# Patient Record
Sex: Female | Born: 1955
Health system: Southern US, Community
[De-identification: ages and names within clinical notes are randomized; demographics above are authoritative.]

## PROBLEM LIST (undated history)

## (undated) DIAGNOSIS — F32A Depression, unspecified: Secondary | ICD-10-CM

## (undated) DIAGNOSIS — F329 Major depressive disorder, single episode, unspecified: Secondary | ICD-10-CM

## (undated) DIAGNOSIS — M5430 Sciatica, unspecified side: Secondary | ICD-10-CM

## (undated) DIAGNOSIS — M1990 Unspecified osteoarthritis, unspecified site: Secondary | ICD-10-CM

## (undated) DIAGNOSIS — E876 Hypokalemia: Secondary | ICD-10-CM

## (undated) DIAGNOSIS — I639 Cerebral infarction, unspecified: Secondary | ICD-10-CM

## (undated) DIAGNOSIS — F419 Anxiety disorder, unspecified: Secondary | ICD-10-CM

## (undated) DIAGNOSIS — R51 Headache: Secondary | ICD-10-CM

## (undated) DIAGNOSIS — D649 Anemia, unspecified: Secondary | ICD-10-CM

## (undated) DIAGNOSIS — I10 Essential (primary) hypertension: Secondary | ICD-10-CM

## (undated) HISTORY — PX: ENDOMETRIAL ABLATION: SHX621

## (undated) HISTORY — PX: BLADDER SURGERY: SHX569

## (undated) HISTORY — PX: TUBAL LIGATION: SHX77

## (undated) HISTORY — DX: Sciatica, unspecified side: M54.30

## (undated) HISTORY — DX: Anxiety disorder, unspecified: F41.9

## (undated) HISTORY — DX: Hypokalemia: E87.6

## (undated) HISTORY — DX: Anemia, unspecified: D64.9

## (undated) HISTORY — PX: BLADDER SUSPENSION: SHX72

## (undated) HISTORY — DX: Depression, unspecified: F32.A

## (undated) HISTORY — DX: Cerebral infarction, unspecified: I63.9

## (undated) HISTORY — DX: Essential (primary) hypertension: I10

## (undated) HISTORY — DX: Headache: R51

## (undated) HISTORY — DX: Unspecified osteoarthritis, unspecified site: M19.90

## (undated) HISTORY — PX: APPENDECTOMY: SHX54

## (undated) HISTORY — DX: Major depressive disorder, single episode, unspecified: F32.9

---

## 2005-09-13 ENCOUNTER — Other Ambulatory Visit: Admission: RE | Admit: 2005-09-13 | Discharge: 2005-09-13 | Payer: Self-pay | Admitting: Gynecology

## 2005-09-20 ENCOUNTER — Encounter: Admission: RE | Admit: 2005-09-20 | Discharge: 2005-09-20 | Payer: Self-pay | Admitting: Gynecology

## 2005-09-28 ENCOUNTER — Ambulatory Visit: Payer: Self-pay | Admitting: Internal Medicine

## 2005-10-05 ENCOUNTER — Encounter: Admission: RE | Admit: 2005-10-05 | Discharge: 2005-10-05 | Payer: Self-pay | Admitting: Gynecology

## 2005-10-12 ENCOUNTER — Ambulatory Visit: Payer: Self-pay | Admitting: Internal Medicine

## 2006-10-10 ENCOUNTER — Other Ambulatory Visit: Admission: RE | Admit: 2006-10-10 | Discharge: 2006-10-10 | Payer: Self-pay | Admitting: Gynecology

## 2006-10-13 ENCOUNTER — Ambulatory Visit (HOSPITAL_COMMUNITY): Admission: RE | Admit: 2006-10-13 | Discharge: 2006-10-13 | Payer: Self-pay | Admitting: Gynecology

## 2006-11-07 ENCOUNTER — Encounter: Admission: RE | Admit: 2006-11-07 | Discharge: 2006-11-07 | Payer: Self-pay | Admitting: Gynecology

## 2006-11-15 ENCOUNTER — Encounter: Payer: Self-pay | Admitting: Internal Medicine

## 2006-12-04 ENCOUNTER — Encounter (INDEPENDENT_AMBULATORY_CARE_PROVIDER_SITE_OTHER): Payer: Self-pay | Admitting: *Deleted

## 2006-12-14 ENCOUNTER — Ambulatory Visit: Payer: Self-pay | Admitting: Internal Medicine

## 2006-12-14 DIAGNOSIS — D649 Anemia, unspecified: Secondary | ICD-10-CM

## 2006-12-14 DIAGNOSIS — R51 Headache: Secondary | ICD-10-CM

## 2006-12-14 DIAGNOSIS — R519 Headache, unspecified: Secondary | ICD-10-CM | POA: Insufficient documentation

## 2006-12-14 DIAGNOSIS — I10 Essential (primary) hypertension: Secondary | ICD-10-CM

## 2007-03-12 ENCOUNTER — Telehealth (INDEPENDENT_AMBULATORY_CARE_PROVIDER_SITE_OTHER): Payer: Self-pay | Admitting: *Deleted

## 2007-03-23 ENCOUNTER — Encounter (INDEPENDENT_AMBULATORY_CARE_PROVIDER_SITE_OTHER): Payer: Self-pay | Admitting: *Deleted

## 2007-04-27 ENCOUNTER — Ambulatory Visit: Payer: Self-pay | Admitting: Internal Medicine

## 2007-07-23 ENCOUNTER — Encounter (INDEPENDENT_AMBULATORY_CARE_PROVIDER_SITE_OTHER): Payer: Self-pay | Admitting: *Deleted

## 2007-08-15 ENCOUNTER — Encounter (INDEPENDENT_AMBULATORY_CARE_PROVIDER_SITE_OTHER): Payer: Self-pay | Admitting: *Deleted

## 2007-08-31 ENCOUNTER — Ambulatory Visit: Payer: Self-pay | Admitting: Internal Medicine

## 2007-08-31 DIAGNOSIS — F3289 Other specified depressive episodes: Secondary | ICD-10-CM | POA: Insufficient documentation

## 2007-08-31 DIAGNOSIS — F419 Anxiety disorder, unspecified: Secondary | ICD-10-CM

## 2007-08-31 DIAGNOSIS — F329 Major depressive disorder, single episode, unspecified: Secondary | ICD-10-CM | POA: Insufficient documentation

## 2007-09-03 LAB — CONVERTED CEMR LAB
BUN: 13 mg/dL (ref 6–23)
Basophils Absolute: 0 10*3/uL (ref 0.0–0.1)
Basophils Relative: 0.2 % (ref 0.0–3.0)
Calcium: 9.2 mg/dL (ref 8.4–10.5)
Chloride: 104 meq/L (ref 96–112)
Creatinine, Ser: 0.8 mg/dL (ref 0.4–1.2)
Eosinophils Relative: 3 % (ref 0.0–5.0)
Glucose, Bld: 83 mg/dL (ref 70–99)
Lymphocytes Relative: 23.2 % (ref 12.0–46.0)
MCHC: 33.2 g/dL (ref 30.0–36.0)
Monocytes Relative: 8 % (ref 3.0–12.0)
Neutro Abs: 6.6 10*3/uL (ref 1.4–7.7)
RDW: 12.7 % (ref 11.5–14.6)
Saturation Ratios: 14.5 % — ABNORMAL LOW (ref 20.0–50.0)
Sodium: 139 meq/L (ref 135–145)
TSH: 2.14 microintl units/mL (ref 0.35–5.50)
Transferrin: 236.2 mg/dL (ref >=212.0)

## 2007-09-21 ENCOUNTER — Encounter (INDEPENDENT_AMBULATORY_CARE_PROVIDER_SITE_OTHER): Payer: Self-pay | Admitting: *Deleted

## 2007-09-28 ENCOUNTER — Ambulatory Visit: Payer: Self-pay | Admitting: Internal Medicine

## 2007-10-12 ENCOUNTER — Telehealth (INDEPENDENT_AMBULATORY_CARE_PROVIDER_SITE_OTHER): Payer: Self-pay | Admitting: *Deleted

## 2007-10-12 ENCOUNTER — Ambulatory Visit: Payer: Self-pay | Admitting: Internal Medicine

## 2007-10-12 LAB — CONVERTED CEMR LAB
BUN: 16 mg/dL (ref 6–23)
CO2: 28 meq/L (ref 19–32)
Chloride: 103 meq/L (ref 96–112)
Glucose, Bld: 98 mg/dL (ref 70–99)
Sodium: 139 meq/L (ref 135–145)

## 2007-10-18 ENCOUNTER — Encounter (INDEPENDENT_AMBULATORY_CARE_PROVIDER_SITE_OTHER): Payer: Self-pay | Admitting: *Deleted

## 2007-12-24 ENCOUNTER — Ambulatory Visit: Payer: Self-pay | Admitting: Internal Medicine

## 2007-12-31 ENCOUNTER — Telehealth (INDEPENDENT_AMBULATORY_CARE_PROVIDER_SITE_OTHER): Payer: Self-pay | Admitting: *Deleted

## 2007-12-31 LAB — CONVERTED CEMR LAB
BUN: 14 mg/dL (ref 6–23)
GFR calc Af Amer: 114 mL/min

## 2008-01-14 ENCOUNTER — Encounter: Payer: Self-pay | Admitting: Internal Medicine

## 2008-01-24 ENCOUNTER — Encounter (INDEPENDENT_AMBULATORY_CARE_PROVIDER_SITE_OTHER): Payer: Self-pay | Admitting: *Deleted

## 2008-03-24 ENCOUNTER — Encounter (INDEPENDENT_AMBULATORY_CARE_PROVIDER_SITE_OTHER): Payer: Self-pay | Admitting: *Deleted

## 2009-02-03 ENCOUNTER — Ambulatory Visit: Payer: Self-pay | Admitting: Gynecology

## 2009-02-13 ENCOUNTER — Ambulatory Visit: Payer: Self-pay | Admitting: Internal Medicine

## 2009-03-18 ENCOUNTER — Telehealth (INDEPENDENT_AMBULATORY_CARE_PROVIDER_SITE_OTHER): Payer: Self-pay | Admitting: *Deleted

## 2009-03-26 ENCOUNTER — Ambulatory Visit: Payer: Self-pay | Admitting: Internal Medicine

## 2009-03-27 ENCOUNTER — Encounter (INDEPENDENT_AMBULATORY_CARE_PROVIDER_SITE_OTHER): Payer: Self-pay | Admitting: *Deleted

## 2009-03-30 LAB — CONVERTED CEMR LAB
Basophils Absolute: 0.1 10*3/uL (ref 0.0–0.1)
Basophils Relative: 0.7 % (ref 0.0–3.0)
Eosinophils Absolute: 0.2 10*3/uL (ref 0.0–0.7)
Eosinophils Relative: 2.1 % (ref 0.0–5.0)
HDL: 41.4 mg/dL (ref >39.00)
Hemoglobin: 12.7 g/dL (ref 12.0–15.0)
Lymphocytes Relative: 24.7 % (ref 12.0–46.0)
Lymphs Abs: 2.2 10*3/uL (ref 0.7–4.0)
MCHC: 33.5 g/dL (ref 30.0–36.0)
Monocytes Absolute: 0.7 10*3/uL (ref 0.1–1.0)
Monocytes Relative: 7.6 % (ref 3.0–12.0)
Neutro Abs: 5.6 10*3/uL (ref 1.4–7.7)
Neutrophils Relative %: 64.9 % (ref 43.0–77.0)
Platelets: 239 10*3/uL (ref 150.0–400.0)
Potassium: 3 meq/L — ABNORMAL LOW (ref 3.5–5.1)
RDW: 12.6 % (ref 11.5–14.6)
Sodium: 142 meq/L (ref 135–145)
WBC: 8.8 10*3/uL (ref 4.5–10.5)

## 2009-04-20 ENCOUNTER — Ambulatory Visit: Payer: Self-pay | Admitting: Internal Medicine

## 2009-04-20 ENCOUNTER — Telehealth: Payer: Self-pay | Admitting: Internal Medicine

## 2009-08-04 ENCOUNTER — Ambulatory Visit: Payer: Self-pay | Admitting: Internal Medicine

## 2009-10-13 ENCOUNTER — Telehealth (INDEPENDENT_AMBULATORY_CARE_PROVIDER_SITE_OTHER): Payer: Self-pay | Admitting: *Deleted

## 2009-12-23 ENCOUNTER — Encounter (INDEPENDENT_AMBULATORY_CARE_PROVIDER_SITE_OTHER): Payer: Self-pay | Admitting: Internal Medicine

## 2009-12-23 ENCOUNTER — Observation Stay (HOSPITAL_COMMUNITY)
Admission: EM | Admit: 2009-12-23 | Discharge: 2009-12-24 | Payer: Self-pay | Source: Home / Self Care | Attending: Internal Medicine | Admitting: Internal Medicine

## 2010-01-19 ENCOUNTER — Other Ambulatory Visit: Payer: Self-pay | Admitting: Internal Medicine

## 2010-01-19 ENCOUNTER — Ambulatory Visit
Admission: RE | Admit: 2010-01-19 | Discharge: 2010-01-19 | Payer: Self-pay | Source: Home / Self Care | Attending: Internal Medicine | Admitting: Internal Medicine

## 2010-01-19 DIAGNOSIS — R0989 Other specified symptoms and signs involving the circulatory and respiratory systems: Secondary | ICD-10-CM

## 2010-01-19 DIAGNOSIS — R0609 Other forms of dyspnea: Secondary | ICD-10-CM | POA: Insufficient documentation

## 2010-01-19 DIAGNOSIS — E876 Hypokalemia: Secondary | ICD-10-CM | POA: Insufficient documentation

## 2010-01-20 LAB — BASIC METABOLIC PANEL
BUN: 19 mg/dL (ref 6–23)
CO2: 29 mEq/L (ref 19–32)
Calcium: 9.2 mg/dL (ref 8.4–10.5)
Chloride: 102 mEq/L (ref 96–112)
Creatinine, Ser: 0.6 mg/dL (ref 0.4–1.2)
GFR: 111.34 mL/min (ref >60.00)
Glucose, Bld: 77 mg/dL (ref 70–99)
Potassium: 3 mEq/L — ABNORMAL LOW (ref 3.5–5.1)
Sodium: 139 mEq/L (ref 135–145)

## 2010-01-23 ENCOUNTER — Encounter: Payer: Self-pay | Admitting: Gynecology

## 2010-01-24 ENCOUNTER — Encounter: Payer: Self-pay | Admitting: Gynecology

## 2010-01-27 ENCOUNTER — Encounter (INDEPENDENT_AMBULATORY_CARE_PROVIDER_SITE_OTHER): Payer: Self-pay | Admitting: *Deleted

## 2010-02-01 ENCOUNTER — Ambulatory Visit
Admission: RE | Admit: 2010-02-01 | Discharge: 2010-02-01 | Payer: Self-pay | Source: Home / Self Care | Attending: Internal Medicine | Admitting: Internal Medicine

## 2010-02-02 NOTE — Progress Notes (Addendum)
Summary: ifob  call from lab - pt never returned ifob kit [facility]  April 20, 2009 2:58 PM send letter encouraging her to return the [employer] E. Adiya Selmer MD  April 20, 2009 5:04 PM   Ms. Noblet, Please return the stool kit that was provided to you at the last office visit.  You can return it in the envelope that was in the kit.  Please call me if you have any questions.  [phone] Shary Decamp  April 21, 2009 4:57 PM

## 2010-02-02 NOTE — Progress Notes (Addendum)
Summary: METOPROLOL REFILL  Phone Note Refill Request Call back at [phone] Message from:  Patient on October 13, 2009 11:43 AM  Refills Requested: Medication #1:  METOPROLOL TARTRATE 50 MG TABS TAKE 1 by mouth BID [facility]---[place], [place]---[address]  Initial call taken by: Jerolyn Shin,  October 13, 2009 11:44 AM    Prescriptions: METOPROLOL TARTRATE 50 MG TABS (METOPROLOL TARTRATE) TAKE 1 by mouth BID  #60 x 6   Entered by:   Army Fossa CMA   Authorized by:   Nolon Rod. Paz MD   Signed by:   Army Fossa CMA on 10/13/2009   Method used:   Electronically to        [facility] # [id number]* (retail)       [address] [place], [place]  [address]       Ph: [phone]       Fax: [phone]   RxID:   [id number]

## 2010-02-02 NOTE — Assessment & Plan Note (Addendum)
Summary: cpx/ns/fastong/kdc   Vital Signs:  Patient profile:   55 year old female Height:      64 inches Weight:      155.2 pounds BMI:     26.74 Pulse rate:   54 / minute BP sitting:   132 / 80  Vitals Entered By: Shary Decamp (March 26, 2009 8:44 AM) CC: cpx --   History of Present Illness: CPX recovering from the flu: F-cough-N-lack of appetite    Preventive Screening-Counseling & Management  Caffeine-Diet-Exercise     Does Patient Exercise: yes     Times/week: 2      Drug Use:  no.    Current Medications (verified): 1)  Metoprolol Tartrate 50 Mg Tabs (Metoprolol Tartrate) .... Take 1 By Mouth Bid  Allergies (verified): 1)  ! Lisinopril (Lisinopril)  Past History:  Past Medical History: Reviewed history from 08/31/2007 and no changes required. Anemia-NOS, h/o Hypertension Headache Anxiety Depression  Past Surgical History: Reviewed history from 12/14/2006 and no changes required. 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 Resp:  Denies cough and shortness of breath. GI:  Denies bloody stools. GU:  Denies dysuria and hematuria. Psych:  history of anxiety and depression, off meds  "don't like to take" symptoms ok, does have some family problems, on off mood is depressive.  Physical Exam  General:  alert, well-developed, and well-nourished.   Neck:  no masses and no thyromegaly.   Lungs:  normal respiratory effort, no intercostal retractions, no accessory muscle use, and  normal breath sounds.   Heart:  normal rate, regular rhythm, no murmur, and no gallop.   Abdomen:  soft, non-tender, no distention, no masses, no guarding, and no rigidity.   Extremities:  no edema Psych:  Cognition and judgment appear intact. Alert and cooperative with normal attention span and concentration. not anxious appearing and not depressed appearing.     Impression & Recommendations:  Problem # 1:  ROUTINE GENERAL MEDICAL EXAM@HEALTH  CARE FACL (ICD-V70.0) she does have a healthy diet, encouraged to do more exercises Td > 10 years ---got one today never had a cscope , no family history Colonoscopy Vs.iFOB cards reviewed w/ pt. Provided  iFOB but he will  call if he decides to have a  colonoscopy  female care per gyn due for MMG , got a letter,encouraged to go ahead and have a MMG never had a bone density test, she is not on her menopause yet  Orders: Venipuncture (28315) TLB-ALT (SGPT) (84460-ALT) TLB-AST (SGOT) (84450-SGOT) TLB-BMP (Basic Metabolic Panel-BMET) (80048-METABOL) TLB-CBC Platelet - w/Differential (85025-CBCD) TLB-TSH (Thyroid Stimulating Hormone) (84443-TSH) TLB-Lipid Panel (80061-LIPID) EKG w/ Interpretation (93000)  Problem # 2:  HYPERTENSION (ICD-401.9) well  controlled  on metoprolol, pulse is slightly low but patient asymptomatic, no change advised to call us if she feels weak, dizzy when she stands up  EKG, sinus bradycardia Her updated medication list for this problem includes:    Metoprolol Tartrate 50 Mg Tabs (Metoprolol tartrate) .Marland Kitchen... Take 1 by mouth bid  BP today: 132/80 Prior BP: 150/100 (  02/13/2009)  Labs Reviewed: K+: 3.2 (12/24/2007) Creat: : 0.7 (12/24/2007)     Complete Medication List: 1)  Metoprolol Tartrate 50 Mg Tabs (Metoprolol tartrate) .... Take 1 by mouth bid  Other Orders: Tdap => 80yrs IM (84696) Admin 1st Vaccine (29528) Admin 1st Vaccine [facility]) [phone])  Patient Instructions: 1)  Please schedule a follow-up  appointment in 4 months .    Risk Factors:  Tobacco use:  never Drug use:  no Alcohol use:  no Exercise:  yes    Times per week:  2   Tetanus/Td Vaccine    Vaccine Type: Tdap    Site: left deltoid    Mfr: GlaxoSmithKline    Dose: 0.5 ml    Route: IM    Given by: Shary Decamp    Exp. Date: 03/28/2011    Lot #: [id number]

## 2010-02-02 NOTE — Letter (Addendum)
Summary: [facility]: Immunoassay Fecal Occult Blood (iFOB) Order Form  [facility] [place]  [address] [place], [place] [address]   Phone: [phone]  Fax: [phone]      [facility]: Immunoassay Fecal Occult Blood (iFOB) Order Form   March 27, 2009 MRN: [id number]   [name] 10-10-1955   Physicican Name:______paz___________________  Diagnosis Code:________v76.51__________________      [name]

## 2010-02-02 NOTE — Progress Notes (Addendum)
Summary: [facility] 3/18, 3/23  Phone Note Call from Patient Call back at Home Phone [phone]   Caller: Spouse Summary of Call: chest pain (center), HA x yesterday.   Pt with hx of HTN....  Advised husband to take pt to ED for evaluation Shary Decamp  March 18, 2009 8:30 AM   Follow-up for Phone Call        check on her in a few days Follow-up by: [facility] E. Paz MD,  March 18, 2009 4:52 PM  Additional Follow-up for Phone Call Additional follow up Details #1::        left message on machine for pt to return call Shary Decamp  March 20, 2009 9:34 AM  left message on machine for pt to return call Shary Decamp  March 25, 2009 2:33 PM  [pt here for ov today Shary Decamp  March 26, 2009 8:36 AM

## 2010-02-02 NOTE — Assessment & Plan Note (Addendum)
Summary: follow up on bp meds//lh   Vital Signs:  Patient profile:   55 year old female Height:      64 inches Weight:      157.8 pounds BMI:     27.18 Pulse rate:   80 / minute BP sitting:   150 / 100  Vitals Entered By: Shary Decamp (February 13, 2009 8:21 AM) CC: rov   History of Present Illness: last OV 12-09 walk in BP was elevated @ her gynecologist office , she was off meds x 3 days  she used to be on : Metoprolol Tartrate 50 Mg Tabs (Metoprolol tartrate) .... Take 1 by mouth bid Klor-con M10 10 Meq   her  gyn  Rx  metoprolol HCT 50/25 feels well  BP today elevated   Current Medications (verified): 1)  Metoprolol Tartrate 50 Mg Tabs (Metoprolol Tartrate) .... Take 1 By Mouth Bid 2)  Klor-Con M10 10 Meq Cr-Tabs (Potassium Chloride Crys Cr) .Marland Kitchen.. 1 By Mouth Once Daily 3)  Fluoxetine Hcl 20 Mg Caps (Fluoxetine Hcl) .Marland Kitchen.. 1 A Day (Patient Takes It As Needed) 4)  Flexeril 10 Mg  Tabs (Cyclobenzaprine Hcl) .Marland Kitchen.. 1 By Mouth At Bedtime As Needed Headache  Allergies (verified): 1)  ! Lisinopril (Lisinopril)  Past History:  Past Medical History: Reviewed history from 08/31/2007 and no changes required. Anemia-NOS, h/o Hypertension Headache Anxiety Depression  Past Surgical History: Reviewed history from 12/14/2006 and no changes required. Appendectomy Caesarean section Tubal ligation bladder surgery  Social History: Reviewed history from 08/31/2007 and no changes required. Married 3 kids  Review of Systems       h/o migraines, still has HA on-off , usually related to her periods some LE edema  CV:  Denies chest pain or discomfort and shortness of breath with exertion. GI:  Denies nausea and vomiting.  Physical Exam  General:  alert, well-developed, and well-nourished.   Lungs:  normal respiratory effort, no intercostal retractions, no accessory muscle use, and normal breath sounds.   Heart:  normal rate, regular rhythm, and no murmur.   Extremities:   no edema   Impression & Recommendations:  Problem # 1:  HYPERTENSION (ICD-401.9) in the past, BP was well controlled with metoprolol 50 b.i.d. she had some problems with potassium being low, consequently HCTZ may not be a good choice for her Will discontinue her metoprolol HCT which was prescribed a few days ago and put her back on plain  metoprolol, recheck potassium on return to the office   Her updated medication list for this problem includes:    Metoprolol Tartrate 50 Mg Tabs (Metoprolol tartrate) .Marland Kitchen... Take 1 by mouth bid  Complete Medication List: 1)  Metoprolol Tartrate 50 Mg Tabs (Metoprolol tartrate) .... Take 1 by mouth bid  Patient Instructions: 1)  take metoprolol twice a day 2)  office  visit in one month, fasting, for your yearly checkup Prescriptions: METOPROLOL TARTRATE 50 MG TABS (METOPROLOL TARTRATE) TAKE 1 by mouth BID  #60 x 6   Entered and Authorized by:   Nolon Rod. Geovonni Meyerhoff MD   Signed by:   Nolon Rod. Melis Trochez MD on 02/13/2009   Method used:   Print then Give to Patient   RxID:   [id number]

## 2010-02-02 NOTE — Assessment & Plan Note (Addendum)
Summary: 4 month followup/kn   Vital Signs:  Patient profile:   55 year old female Weight:      154.13 pounds Pulse rate:   65 / minute Pulse rhythm:   regular BP sitting:   140 / 82  (left arm) Cuff size:   large  Vitals Entered By: Army Fossa CMA (August 04, 2009 11:06 AM) CC: [name] here for 4 month f/u. Comments - F/u on BP. - Was seen in Hospital last week for pain in Right leg- states its better now.   History of Present Illness: acute back pain last week Pain was left-sided, rad.  to the left leg up to the heel She went to [facility], x-rays were done, she got 2 shots and prescribed prednisone She also was prescribed Flexeril but she discontinued  due to somnolence  Overall the pain is now decreased, she still has on and off "cramps and pain" in the left leg  ROS Denies fevers No bladder or bowel incontinence  Current Medications (verified): 1)  Metoprolol Tartrate 50 Mg Tabs (Metoprolol Tartrate) .... Take 1 By Mouth Bid 2)  Prednisone 10 Mg Tabs (Prednisone) 3)  Ibuprofen 800 Mg Tabs (Ibuprofen) .Marland Kitchen.. 1 By Mouth Three Times A Day 4)  Flexeril 10 Mg Tabs (Cyclobenzaprine Hcl) .Marland Kitchen.. 1 By Mouth Three Times A Day As Needed  Allergies: 1)  ! Lisinopril (Lisinopril)  Past History:  Past Medical History: Reviewed history from 08/31/2007 and no changes required. Anemia-NOS, h/o Hypertension Headache Anxiety Depression  Past Surgical History: Reviewed history from 12/14/2006 and no changes required. Appendectomy Caesarean section Tubal ligation bladder surgery  Social History: Reviewed history from 03/26/2009 and no changes required. 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   Msk:  not tender to palpation at the lower spine or sacroiliac area Extremities:  no lower extremity  edema Neurologic:  gait normal Posture slightly antalgic Lower extremity motor exam normal Reflexes symmetric except for decrease ankle jerk on the left Straight leg test positive on the left   Impression & Recommendations:  Problem # 1:  BACK PAIN, ACUTE (ICD-724.5)  acute lumbalgia with radiculopathy features No fever, no bladder or bowel incontinence Plan: Finish  prednisone somnolence was likely from Flexeril, she was also prescribed ibuprofen. Will continue with ibuprofen and Hydrocodone as needed , see instructions MRI ortho referral The following medications were removed from the medication list:    Flexeril 10 Mg Tabs (Cyclobenzaprine hcl) .Marland Kitchen... 1 by mouth three times a day as needed Her updated medication list for this problem includes:    Ibuprofen 800 Mg Tabs (Ibuprofen) .Marland Kitchen... 1 by mouth three times a day    Hydrocodone-acetaminophen 5-500 Mg Tabs (Hydrocodone-acetaminophen) ..... One tablet every 4 hours as needed for pain  Orders: Radiology Referral (Radiology) Orthopedic Referral (Ortho)  Problem # 2:  HYPERTENSION (ICD-401.9) well-controlled History of hypokalemia, currently prednisone. Plan to recheck her potassium when she comes back Her updated medication list for this problem includes:    Metoprolol Tartrate 50 Mg Tabs (Metoprolol tartrate) .Marland Kitchen... Take 1 by mouth bid  BP today: 140/82 Prior BP: 132/80 (03/26/2009)  Labs Reviewed: K+: 3.0 (03/26/2009) Creat: : 0.8 (03/26/2009)   Chol: 162 (03/26/2009)   HDL: 41.40 (03/26/2009)   LDL: 92 (03/26/2009)   TG: 144.0 (03/26/2009)  Complete Medication List: 1)  Metoprolol Tartrate 50 Mg Tabs (Metoprolol tartrate) .... Take 1 by mouth bid 2)  Prednisone 10 Mg Tabs (Prednisone) 3)  Ibuprofen 800 Mg Tabs (Ibuprofen) .Marland Kitchen.. 1 by mouth three times a day 4)  Hydrocodone-acetaminophen 5-500 Mg Tabs (Hydrocodone-acetaminophen) .... One tablet every 4 hours as needed for pain  Patient Instructions: 1)  for pain take  ibuprofen 800 mg every 8 hours as needed. Always take it  with food. Watch for stomach irritation  2)  If the pain is severe, instead of ibuprofen  take hydrocodone 3)  Please schedule a follow-up appointment in 2 months.  Prescriptions: HYDROCODONE-ACETAMINOPHEN 5-500 MG TABS (HYDROCODONE-ACETAMINOPHEN) one tablet every 4 hours as needed for pain  #30 x 0   Entered and Authorized by:   Nolon Rod. Leata Dominy MD   Signed by:   Nolon Rod. Jene Oravec MD on 08/04/2009   Method used:   Print then Give to Patient   RxID:   [id number]

## 2010-02-04 NOTE — Assessment & Plan Note (Addendum)
Summary: Hospital followup---...   Vital Signs:  Patient profile:   55 year old female Height:      64 inches Weight:      160.13 pounds BMI:     27.59 Pulse rate:   86 / minute Pulse rhythm:   regular BP sitting:   152 / 98  (left arm) Cuff size:   large  Vitals Entered By: Army Fossa CMA (January 19, 2010 3:13 PM)  History of Present Illness: here for hospital followup, chart is reviewed   DATE OF ADMISSION:  12/23/2009   DATE OF DISCHARGE:  12/24/2009      DISCHARGE DIAGNOSES:   1. Chest pain/back pain.   2. Diastolic heart failure.   3. Hypertension.   4. Hypokalemia.    PROCEDURES PERFORMED:   1. Two-D echo that showed left ventricular function was within normal       with an EF of 60-65%.  She has a grade 2 diastolic heart failure,       left atrium was mildly dilated.   2. CT angio of the chest showed negative CT angio with no evidence of       PE, some cardiomegaly, nodular goiter.    Chest x-ray:  Cardiomegaly   without edema.    labs On admission potassium was 2.9,  Mg was normal, creatinine 0.7. CBC was normal Total cholesterol 158, LDL 87, HDL 44 TSH normal Hemoglobin A1c 5.8 aldosterone  and cortisone levels  within normal. --------------------------------------------------------------------------------------------- the patient was admitted with uncontrolled hypertension, initial BP was 210/110. She also had chest pain and left back pain felt to be musculoskeletal but she was recommended outpatient stress test. she was noted to be severely hypokalemic upon admission, she was only Rx  beta blockers . But she reports that she was actually taking no medicines  She was discharged in the following medications: Hydrochlorothiazide 12.5 mg p.o. daily  ibuprofen 600 mg p.o. q.6-8 h. p.r.n.  lisinopril 10 mg daily-------------------not taking  metoprolol 25 mg b.i.d. omeprazole 40 mg daily  K-Dur 20 mEq daily-------------------------- not taking   calcium 1 tablet daily  Excedrin 1 tablet q.6 h. multivitamin 1  tablet daily.       Current Medications (verified): 1)  Metoprolol Tartrate 25 Mg Tabs (Metoprolol Tartrate) .Marland Kitchen.. 1 By Mouth Bid 2)  Ibuprofen 800 Mg Tabs (Ibuprofen) .Marland Kitchen.. 1 By Mouth Three Times A Day 3)  Hydrochlorothiazide 12.5 Mg Caps (Hydrochlorothiazide) .Marland Kitchen.. 1 By Mouth Once Daily. 4)  Omeprazole 40 Mg Cpdr (Omeprazole) .Marland Kitchen.. 1 By Mouth Once Daily  Allergies (verified): 1)  ! Lisinopril (Lisinopril)  Past History:  Past Medical History: Anemia-NOS, h/o Hypertension Hypokalemia  Headache Anxiety, Depression  Past Surgical History: Reviewed history from 12/14/2006 and no changes required. Appendectomy Caesarean section Tubal ligation bladder surgery  Family History: Reviewed history from 03/26/2009 and no changes required. MI-- father age 73 DM--aunt HTN-- many family memebers  colon ca--no Breast ca--distant relative  Social History: Reviewed history from 03/26/2009 and no changes required. 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 She continued to have dyspnea on exertion no nausea, vomiting, diarrhea No fever or growth Taking Prilosec regularly, no GERD symptoms  Physical Exam  General:  alert, well-developed, and well-nourished.  Lungs:  normal respiratory effort, no intercostal retractions, no accessory muscle use, and normal breath sounds.   Heart:  normal rate, regular rhythm, no murmur, and no gallop.   Abdomen:  soft, non-tender, no distention, no masses, no guarding, and no rigidity.   Extremities:  no pretibial edema bilaterally    Impression & Recommendations:  Problem # 1:  HYPERTENSION (ICD-401.9) recently admitted with severe  hypertension and hypokalemia in addition of her current meds, she was recommended to take lisinopril  and potassium but she's not. her BP is still elevated. due to the severe hypertension and hypokalemia (on no medicines) , I will refer her to nephrology noting that her aldosterone was normal at the hospital today I will check a BMP  Will add losartan d/c HCTZ  Her updated medication list for this problem includes:    Metoprolol Tartrate 25 Mg Tabs (Metoprolol tartrate) .Marland Kitchen... 1 by mouth bid    Losartan Potassium 50 Mg Tabs (Losartan potassium) ..... One by mouth daily  Orders: Venipuncture (95638) TLB-BMP (Basic Metabolic Panel-BMET) (80048-METABOL) Specimen Handling (75643) Nephrology Referral (Nephro)  Problem # 2:  HYPOKALEMIA (ICD-276.8)  see #1  Orders: Nephrology Referral (Nephro)  Problem # 3:  DYSPNEA ON EXERTION (ICD-786.09) recent episode of chest pain which is now resolved Continue with  dyspnea on exertion. recent echocardiogram showed  a grade 2 diastolic heart failure   left atrium was mildly dilated.  plan: stress test once BP better  Her updated medication list for this problem includes:    Metoprolol Tartrate 25 Mg Tabs (Metoprolol tartrate) .Marland Kitchen... 1 by mouth bid  Problem # 4:  ? of GERD (ICD-530.81) onn PPIs, reason ? change omeprazole to as needed  Her updated medication list for this problem includes:    Omeprazole 40 Mg Cpdr (Omeprazole) .Marland Kitchen... 1 by mouth once daily only as needed if indigestion  Problem # 5:  I spent additional time reviewing her new medication list  Complete Medication List: 1)  Metoprolol Tartrate 25 Mg Tabs (Metoprolol tartrate) .Marland Kitchen.. 1 by mouth bid 2)  Losartan Potassium 50 Mg Tabs (Losartan potassium) .... One by mouth daily 3)  Omeprazole 40 Mg Cpdr (Omeprazole) .Marland Kitchen.. 1 by mouth once daily only as needed if indigestion 4)  Ibuprofen 800 Mg Tabs (Ibuprofen) .Marland Kitchen.. 1 by mouth three times a day only as needed  Patient  Instructions: 1)  Please schedule a follow-up appointment in 2 weeks.  Prescriptions: LOSARTAN POTASSIUM 50 MG TABS (LOSARTAN POTASSIUM) one by mouth daily  #30 x 1   Entered and Authorized by:   Elita Quick E. Paz MD   Signed by:   Nolon Rod. Paz MD on 01/19/2010   Method used:   Print then Give to Patient   RxID:   [id number]    Orders Added: 1)  Venipuncture [60109] 2)  TLB-BMP (Basic Metabolic Panel-BMET) [80048-METABOL] 3)  Specimen Handling [99000] 4)  Est. Patient Level IV [32355] 5)  Nephrology Referral [Nephro]

## 2010-02-04 NOTE — Letter (Addendum)
Summary: Unable To Reach-Consult Scheduled  [facility] [place]  [address] [place], [place] [address]   Phone: [phone]  Fax: [phone]    01/27/2010 MRN: [id number]    Dear Ms. Carmin Richmond,   We have been unable to reach you by phone.  Please contact our office with an updated phone number.     If you have any question please call us.     Thank you,  Army Fossa CMA  January 27, 2010 1:37 PM

## 2010-02-10 NOTE — Assessment & Plan Note (Addendum)
Summary: 2 week followup///sph   Vital Signs:  Patient profile:   55 year old female Height:      64 inches (162.56 cm) Weight:      157.38 pounds (71.54 kg) BMI:     27.11 Temp:     98.3 degrees F (36.83 degrees C) oral BP sitting:   160 / 100  (left arm) Cuff size:   regular  Vitals Entered By: Lucious Groves CMA (February 01, 2010 3:20 PM) CC: 2 week follow up./kb Is Patient Diabetic? No Pain Assessment Patient in pain? no      Comments Patient notes that she lost Omeprazole prescription.   History of Present Illness: followup from previous visit Unfortunately, she did not start  losartan, she lost her prescription Feels in general well except for a URI that is already getting better.  Today, she also complained of right knee pain for at least 1  week, pain is moderate to intense sometimes. she  wonders if the pain is related to kneeling a lot at work. No redness , mild swelling  ROS Continued with fatigue No chest or shortness of breath + mild headache , takes ibuprofen for it. She did discontinue hydrochlorothiazide as directed  Current Medications (verified): 1)  Metoprolol Tartrate 25 Mg Tabs (Metoprolol Tartrate) .Marland Kitchen.. 1 By Mouth Bid 2)  Losartan Potassium 50 Mg Tabs (Losartan Potassium) .... One By Mouth Daily 3)  Omeprazole 40 Mg Cpdr (Omeprazole) .Marland Kitchen.. 1 By Mouth Once Daily Only As Needed If Indigestion 4)  Ibuprofen 800 Mg Tabs (Ibuprofen) .Marland Kitchen.. 1 By Mouth Three Times A Day Only As Needed  Allergies (verified): 1)  ! Lisinopril (Lisinopril)  Past History:  Past Medical History: Reviewed history from 01/19/2010 and no changes required. Anemia-NOS, h/o Hypertension Hypokalemia  Headache Anxiety, Depression  Past Surgical History: Reviewed history from 12/14/2006 and no changes required. Appendectomy Caesarean section Tubal ligation bladder surgery  Social History: Reviewed history from 03/26/2009 and no changes required. 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   Extremities:  number lower extremity edema Left knee normal Right knee without redness or actual effusion on physical exam ( slightly puffy?). Range of motion is normal, knee  is stable   Impression & Recommendations:  Problem # 1:  HYPERTENSION (ICD-401.9) unfortunately, she did not start losartan as indicated. new prescription provided see instructions  nephrology consultation pending, they will contact the patient directly  Her updated medication list for this problem includes:    Metoprolol Tartrate 25 Mg Tabs (Metoprolol tartrate) .Marland Kitchen... 1 by mouth two times a day    Losartan Potassium 50 Mg Tabs (Losartan potassium) ..... One by mouth daily  BP today: 160/100 Prior BP: 152/98 (01/19/2010)  Labs Reviewed: K+: 3.0 (01/19/2010) Creat: : 0.6 (01/19/2010)   Chol: 162 (03/26/2009)   HDL: 41.40 (03/26/2009)   LDL: 92 (03/26/2009)   TG: 144.0 (03/26/2009)  Problem # 2:  KNEE PAIN (XBJ-478.29) Assessment: New she does a lot of kneeling at work, strongly recommend to use knee  pads. ok  to take ibuprofen or Tylenol as needed Her updated medication list for this problem includes:    Ibuprofen 800 Mg Tabs (Ibuprofen) .Marland Kitchen... 1 by mouth three times a day only as needed  Complete Medication List: 1)  Metoprolol Tartrate 25 Mg Tabs (Metoprolol tartrate) .Marland Kitchen.. 1 by mouth two times a day  2)  Losartan Potassium 50 Mg Tabs (Losartan potassium) .... One by mouth daily 3)  Omeprazole 40 Mg Cpdr (Omeprazole) .Marland Kitchen.. 1 by mouth once daily only as needed if indigestion 4)  Ibuprofen 800 Mg Tabs (Ibuprofen) .Marland Kitchen.. 1 by mouth three times a day only as needed  Patient Instructions: 1)  start losartan today 2)  Come back in 3 weeks for a checkup Prescriptions: METOPROLOL TARTRATE 25 MG TABS (METOPROLOL TARTRATE) 1 by mouth  two times a day  #60 x 1   Entered and Authorized by:   Elita Quick E. Timmi Devora MD   Signed by:   Nolon Rod. Javier Mamone MD on 02/01/2010   Method used:   Print then Give to Patient   RxID:   [id number] LOSARTAN POTASSIUM 50 MG TABS (LOSARTAN POTASSIUM) one by mouth daily  #30 x 1   Entered and Authorized by:   Elita Quick E. Nicholas Trompeter MD   Signed by:   Nolon Rod. Braylie Badami MD on 02/01/2010   Method used:   Print then Give to Patient   RxID:   [id number]    Orders Added: 1)  Est. Patient Level III [10272]

## 2010-02-22 ENCOUNTER — Ambulatory Visit: Payer: Self-pay | Admitting: Internal Medicine

## 2010-03-01 ENCOUNTER — Ambulatory Visit (INDEPENDENT_AMBULATORY_CARE_PROVIDER_SITE_OTHER): Payer: Managed Care, Other (non HMO) | Admitting: Internal Medicine

## 2010-03-01 ENCOUNTER — Other Ambulatory Visit: Payer: Self-pay | Admitting: Internal Medicine

## 2010-03-01 ENCOUNTER — Encounter: Payer: Self-pay | Admitting: Internal Medicine

## 2010-03-01 DIAGNOSIS — I10 Essential (primary) hypertension: Secondary | ICD-10-CM

## 2010-03-02 LAB — BASIC METABOLIC PANEL
BUN: 18 mg/dL (ref 6–23)
CO2: 28 mEq/L (ref 19–32)
Chloride: 99 mEq/L (ref 96–112)
Creatinine, Ser: 0.7 mg/dL (ref 0.4–1.2)

## 2010-03-11 NOTE — Assessment & Plan Note (Addendum)
Summary: rto 3 months/cbs   Vital Signs:  Patient profile:   55 year old female Weight:      158 pounds Pulse rate:   68 / minute Pulse rhythm:   regular BP sitting:   122 / 76  (left arm) Cuff size:   regular  Vitals Entered By: Army Fossa CMA (March 01, 2010 2:53 PM) CC: follow up visit- no complaints Comments [facility] [address]    History of Present Illness:  blood pressure followup Good medication compliance  Feeling really good    Review of systems   no nausea, vomiting, diarrhea  headaches have decreased  had one episode of transient dizziness and weakness, lasted a few hours, no associated slurred speech or focal deficits. Did not check her blood pressure at the time of the symptoms  Current Medications (verified): 1)  Metoprolol Tartrate 25 Mg Tabs (Metoprolol Tartrate) .Marland Kitchen.. 1 By Mouth Two Times A Day 2)  Losartan Potassium 50 Mg Tabs (Losartan Potassium) .... One By Mouth Daily 3)  Omeprazole 40 Mg Cpdr (Omeprazole) .Marland Kitchen.. 1 By Mouth Once Daily Only As Needed If Indigestion 4)  Ibuprofen 800 Mg Tabs (Ibuprofen) .Marland Kitchen.. 1 By Mouth Three Times A Day Only As Needed  Allergies (verified): 1)  ! Lisinopril (Lisinopril)  Past History:  Past Medical History: Reviewed history from 01/19/2010 and no changes required. Anemia-NOS, h/o Hypertension Hypokalemia  Headache Anxiety, Depression  Physical Exam  General:  alert and well-developed.   Lungs:  normal respiratory effort, no intercostal retractions, no accessory muscle use, and normal breath sounds.   Heart:  normal rate, regular rhythm, no murmur, and no gallop.   Neurologic:   speech normal Gait normal No obvious motor deficits   Impression & Recommendations:  Problem # 1:  HYPERTENSION (ICD-401.9) improved  Her updated medication list for this problem includes:    Metoprolol Tartrate 25 Mg Tabs (Metoprolol tartrate) .Marland Kitchen... 1 by mouth two times a day    Losartan Potassium 50 Mg Tabs (Losartan  potassium) ..... One by mouth daily  Orders: Venipuncture (10272) TLB-BMP (Basic Metabolic Panel-BMET) (80048-METABOL) Specimen Handling (53664)  BP today: 122/76 Prior BP: 160/100 (02/01/2010)  Labs Reviewed: K+: 3.0 (01/19/2010) Creat: : 0.6 (01/19/2010)   Chol: 162 (03/26/2009)   HDL: 41.40 (03/26/2009)   LDL: 92 (03/26/2009)   TG: 144.0 (03/26/2009)  Complete Medication List: 1)  Metoprolol Tartrate 25 Mg Tabs (Metoprolol tartrate) .Marland Kitchen.. 1 by mouth two times a day 2)  Losartan Potassium 50 Mg Tabs (Losartan potassium) .... One by mouth daily 3)  Omeprazole 40 Mg Cpdr (Omeprazole) .Marland Kitchen.. 1 by mouth once daily only as needed if indigestion 4)  Ibuprofen 800 Mg Tabs (Ibuprofen) .Marland Kitchen.. 1 by mouth three times a day only as needed  Patient Instructions: 1)  Check your blood pressure 2 or 3 times a week. If it is more than 140/85 consistently,please let us know  2)  Please schedule a follow-up appointment in 6 months .  Prescriptions: LOSARTAN POTASSIUM 50 MG TABS (LOSARTAN POTASSIUM) one by mouth daily  #30 x 6   Entered and Authorized by:   Elita Quick E. Paz MD   Signed by:   Nolon Rod. Paz MD on 03/01/2010   Method used:   Print then Give to Patient   RxID:   [id number] METOPROLOL TARTRATE 25 MG TABS (METOPROLOL TARTRATE) 1 by mouth two times a day  #60 x 6   Entered and Authorized by:   Nolon Rod. Paz MD  Signed by:   Nolon Rod Paz MD on 03/01/2010   Method used:   Print then Give to Patient   RxID:   [id number]    Orders Added: 1)  Venipuncture [56213] 2)  TLB-BMP (Basic Metabolic Panel-BMET) [80048-METABOL] 3)  Specimen Handling [99000] 4)  Est. Patient Level III [08657]

## 2010-03-12 LAB — CONVERTED CEMR LAB
BUN: 18 mg/dL (ref 6–23)
Chloride: 99 meq/L (ref 96–112)
GFR calc non Af Amer: 91.65 mL/min (ref >60.00)
Potassium: 3.4 meq/L — ABNORMAL LOW (ref 3.5–5.1)
Sodium: 136 meq/L (ref 135–145)

## 2010-03-15 LAB — CBC
HCT: 37.8 % (ref 36.0–46.0)
MCH: 29.8 pg (ref 26.0–34.0)
MCHC: 33.6 g/dL (ref 30.0–36.0)
MCV: 86.1 fL (ref 78.0–100.0)
Platelets: 205 10*3/uL (ref 150–400)
Platelets: 208 10*3/uL (ref 150–400)
RBC: 4.39 MIL/uL (ref 3.87–5.11)
RBC: 4.4 MIL/uL (ref 3.87–5.11)
RDW: 12.4 % (ref 11.5–15.5)

## 2010-03-15 LAB — RAPID URINE DRUG SCREEN, HOSP PERFORMED: Tetrahydrocannabinol: NOT DETECTED

## 2010-03-15 LAB — ALDOSTERONE + RENIN ACTIVITY W/ RATIO
ALDO / PRA Ratio: 22.2 Ratio (ref 0.9–28.9)
Aldosterone: 14 ng/dL
PRA LC/MS/MS: 0.63 ng/mL/h (ref 0.25–5.82)

## 2010-03-15 LAB — TSH: TSH: 1.391 u[IU]/mL (ref 0.350–4.500)

## 2010-03-15 LAB — CARDIAC PANEL(CRET KIN+CKTOT+MB+TROPI)
CK, MB: 0.9 ng/mL (ref 0.3–4.0)
CK, MB: 1.1 ng/mL (ref 0.3–4.0)
CK, MB: 1.4 ng/mL (ref 0.3–4.0)
Relative Index: INVALID (ref 0.0–2.5)
Total CK: 57 U/L (ref 7–177)
Total CK: 64 U/L (ref 7–177)
Troponin I: 0.01 ng/mL (ref 0.00–0.06)

## 2010-03-15 LAB — URINALYSIS, ROUTINE W REFLEX MICROSCOPIC
Bilirubin Urine: NEGATIVE
Glucose, UA: NEGATIVE mg/dL
Ketones, ur: NEGATIVE mg/dL
pH: 7 (ref 5.0–8.0)

## 2010-03-15 LAB — LIPID PANEL: Cholesterol: 156 mg/dL (ref 0–200)

## 2010-03-15 LAB — IRON AND TIBC: Iron: 65 ug/dL (ref 42–135)

## 2010-03-15 LAB — POCT I-STAT, CHEM 8
BUN: 11 mg/dL (ref 6–23)
Chloride: 104 mEq/L (ref 96–112)
Creatinine, Ser: 0.7 mg/dL (ref 0.4–1.2)
Glucose, Bld: 97 mg/dL (ref 70–99)
TCO2: 27 mmol/L (ref 0–100)

## 2010-03-15 LAB — COMPREHENSIVE METABOLIC PANEL
ALT: 18 U/L (ref 0–35)
AST: 20 U/L (ref 0–37)
Alkaline Phosphatase: 46 U/L (ref 39–117)
GFR calc non Af Amer: >60 mL/min (ref >60)
Glucose, Bld: 90 mg/dL (ref 70–99)
Potassium: 3.4 mEq/L — ABNORMAL LOW (ref 3.5–5.1)

## 2010-03-15 LAB — POCT CARDIAC MARKERS
CKMB, poc: <1 ng/mL — ABNORMAL LOW (ref 1.0–8.0)
Myoglobin, poc: 24.8 ng/mL (ref 12–200)
Troponin i, poc: <0.05 ng/mL (ref 0.00–0.09)

## 2010-03-15 LAB — DIFFERENTIAL
Monocytes Relative: 7 % (ref 3–12)
Neutro Abs: 5.5 10*3/uL (ref 1.7–7.7)
Neutrophils Relative %: 66 % (ref 43–77)

## 2010-03-15 LAB — MAGNESIUM: Magnesium: 2.3 mg/dL (ref 1.5–2.5)

## 2010-03-15 LAB — HEMOGLOBIN A1C: Mean Plasma Glucose: 120 mg/dL — ABNORMAL HIGH (ref <117)

## 2010-04-01 ENCOUNTER — Encounter: Payer: Self-pay | Admitting: Internal Medicine

## 2010-04-06 ENCOUNTER — Encounter: Payer: Self-pay | Admitting: Internal Medicine

## 2010-04-19 ENCOUNTER — Other Ambulatory Visit (INDEPENDENT_AMBULATORY_CARE_PROVIDER_SITE_OTHER): Payer: Managed Care, Other (non HMO)

## 2010-04-19 DIAGNOSIS — I701 Atherosclerosis of renal artery: Secondary | ICD-10-CM

## 2010-04-22 NOTE — Procedures (Unsigned)
RENAL ARTERY DUPLEX EVALUATION  INDICATION:  Renal artery stenosis.  HISTORY: Diabetes:  No. Cardiac: Hypertension:  Yes. Smoking:  No.  RENAL ARTERY DUPLEX FINDINGS: Aorta-Proximal:  91 cm/s Aorta-Mid:  106 cm/s Aorta-Distal:  128 cm/s Celiac Artery Origin:  123 cm/s SMA Origin:  170 cm/s                                   RIGHT               LEFT Renal Artery Origin:             60                  81 Renal Artery Proximal:           123                 104 Renal Artery Mid:                131                 69 Renal Artery Distal:             106                 102 Hilar Acceleration Time (AT): Renal-Aortic Ratio (RAR):        1.44                1.14 Kidney Size:                     13.4                12.5 End Diastolic Ratio (EDR): Resistive Index (RI):            0.61/0.58           0.67/0.71  IMPRESSION: 1. The abdominal aorta, superior mesenteric artery and celiac axis     arteries appear within normal limits. 2. Bilateral renal aortic ratios are less than 3.5 indicative of a     <60% stenosis. 3. Resistive indices are bilaterally within normal limits. 4. Patent renal veins bilaterally.  ___________________________________________ Di Kindle. Edilia Bo, M.D.  SH/MEDQ  D:  04/19/2010  T:  04/19/2010  Job:  [id number]

## 2010-04-26 ENCOUNTER — Other Ambulatory Visit: Payer: Self-pay | Admitting: Internal Medicine

## 2010-04-27 ENCOUNTER — Ambulatory Visit (INDEPENDENT_AMBULATORY_CARE_PROVIDER_SITE_OTHER): Payer: Managed Care, Other (non HMO) | Admitting: Internal Medicine

## 2010-04-27 ENCOUNTER — Encounter: Payer: Self-pay | Admitting: Internal Medicine

## 2010-04-27 VITALS — BP 160/110 | HR 86 | Wt 158.6 lb

## 2010-04-27 DIAGNOSIS — I10 Essential (primary) hypertension: Secondary | ICD-10-CM

## 2010-04-27 DIAGNOSIS — R51 Headache: Secondary | ICD-10-CM

## 2010-04-27 MED ORDER — METOPROLOL TARTRATE 25 MG PO TABS: 50.0000 mg | ORAL_TABLET | Freq: Two times a day (BID) | ORAL | Status: DC

## 2010-04-27 MED ORDER — LOSARTAN POTASSIUM 50 MG PO TABS: 50.0000 mg | ORAL_TABLET | Freq: Every day | ORAL | Status: DC

## 2010-04-27 NOTE — Assessment & Plan Note (Addendum)
History of severe hypertension, she was admitted to the hospital in December 2011, initial BP was 210/110. Unfortunately, she has a history of poor compliance with medication, she has been very difficult to reach for referrals. Today, I had along discussion with the patient. I strongly recommended good compliance with all medications. Asked her to call for refills if she ran out of medicines. Risks of strokes, renal failure, CHF discussed. Her blood pressure today is 160/110 on metoprolol and amlodipine only. She has poor tolerance with amlodipine consequently we'll: - increase metoprolol to 50 mg twice a day -Stop amlodipine -Restart losartan 50 mg daily. -Come back in 2 weeks

## 2010-04-27 NOTE — Patient Instructions (Signed)
Get a CT of the head Come back in 2 weeks Take metoprolol 2 tablets twice a day and losartan one tablet daily Stop amlodipine Go to the ER if you have any severe headache. ---------------------------------------- hagase el CT de la cabeza regrese en 2 semanas Tome metoprolol 2 pastillas  Dos veces al dia Losartan 1 pastilla diaria No tome amlodipona si tiene un dolor de cabeza severe: tiene que ir a Agricultural engineer

## 2010-04-27 NOTE — Assessment & Plan Note (Addendum)
Long history of headaches, thought to be tensional. For a while the HAs were better but have resurfaced  in the setting of increased blood pressure. Plan: CT of the head without contrast to rule out intracranial bleed. ADDENDUM: unable to get the CT today d/t insurances constrains

## 2010-04-27 NOTE — Progress Notes (Signed)
  Subjective:    Patient ID: [name], female    DOB: [birth year], 55 y.o.   MRN: [id number]  HPI Patient was doing okay until she ran out of losartan about one week ago. Since then her blood pressure started to increase. She saw nephrology a few days ago, they added amlodipine. It is not clear to me if the patient was already out of losartan at that time she saw nephrology. She reports that  amlodipine makes her tired, she would like to stop taking it. Reports good compliance with metoprolol.  Past Medical History  Diagnosis Date  . Anemia   . Hypertension   . Hypokalemia   . Headache   . Anxiety and depression    Past Surgical History  Procedure Date  . Appendectomy   . Cesarean section   . Tubal ligation   . Bladder surgery       Review of Systems Denies any chest pain or shortness of breath Last week had a moderate right-sided headache with some radiation to the right neck and right arm. She felt slightly dizzy but denies slurred speech or double vision. This was not the worse headache of her life. Headache now has essentially subsided, she has been left with mild soreness at the right upper back.       Objective:   Physical Exam  Vitals reviewed. Constitutional: She appears well-developed and well-nourished.  Neck: Normal range of motion. Neck supple.  Cardiovascular: Normal rate, regular rhythm and normal heart sounds.   No murmur heard. Pulmonary/Chest: Effort normal and breath sounds normal. No respiratory distress. She has no wheezes. She has no rales.  Musculoskeletal: She exhibits no edema.          Assessment & Plan:  Today , I spent more than 25 min with the patient, >50% of the time counseling about HTN , see a/p

## 2010-04-29 ENCOUNTER — Encounter: Payer: Self-pay | Admitting: Internal Medicine

## 2010-04-30 ENCOUNTER — Telehealth: Payer: Self-pay | Admitting: Internal Medicine

## 2010-04-30 NOTE — Telephone Encounter (Signed)
Left message for pt on home and cell to advise pt to go to ER if headaches are sever if CT was not scheduled today.

## 2010-04-30 NOTE — Telephone Encounter (Signed)
Insurance company would not approve that a head CT. I called again today. Explained the case to a physician. He immediately agreed. Confirmation number is : [id number].  Please call the patient, see if she could have the  CT head done today. ER if headache severe

## 2010-05-03 ENCOUNTER — Other Ambulatory Visit: Payer: Managed Care, Other (non HMO)

## 2010-05-03 NOTE — Telephone Encounter (Signed)
Per my call to patient who I have scheduled for Ct Head today, 05-03-10 at [facility], patient states she was at [facility] over the weekend, had a Ct Head done there, and will bring the report to Dr. Drue Novel.  This was my understanding, it was difficult to understand patient over the phone.  I will cancel Ct Head at [facility] for today.

## 2010-05-03 NOTE — Telephone Encounter (Signed)
Patient is scheduled for CT Head today, 05-03-10 @ 1:30pm  Ct.  I am trying to reach patient to notify her. /rh 05-03-10

## 2010-05-05 ENCOUNTER — Telehealth: Payer: Self-pay | Admitting: Internal Medicine

## 2010-05-05 NOTE — Telephone Encounter (Signed)
CT dong at [facility] on 05-01-10 was negative. I left a detailed message to the patient: CT negative Followup by mid May as planned  Call if any problems.

## 2010-05-07 ENCOUNTER — Encounter: Payer: Self-pay | Admitting: Internal Medicine

## 2010-05-10 ENCOUNTER — Ambulatory Visit: Payer: Managed Care, Other (non HMO) | Admitting: Internal Medicine

## 2010-05-10 DIAGNOSIS — Z0289 Encounter for other administrative examinations: Secondary | ICD-10-CM

## 2010-05-17 ENCOUNTER — Ambulatory Visit (INDEPENDENT_AMBULATORY_CARE_PROVIDER_SITE_OTHER): Payer: Managed Care, Other (non HMO) | Admitting: Internal Medicine

## 2010-05-17 ENCOUNTER — Encounter: Payer: Self-pay | Admitting: Internal Medicine

## 2010-05-17 VITALS — BP 142/86 | HR 60 | Wt 154.2 lb

## 2010-05-17 DIAGNOSIS — I10 Essential (primary) hypertension: Secondary | ICD-10-CM

## 2010-05-17 DIAGNOSIS — F411 Generalized anxiety disorder: Secondary | ICD-10-CM

## 2010-05-17 DIAGNOSIS — R51 Headache: Secondary | ICD-10-CM

## 2010-05-17 LAB — BASIC METABOLIC PANEL
CO2: 29 mEq/L (ref 19–32)
Calcium: 8.9 mg/dL (ref 8.4–10.5)
Glucose, Bld: 65 mg/dL — ABNORMAL LOW (ref 70–99)
Potassium: 3.4 mEq/L — ABNORMAL LOW (ref 3.5–5.1)
Sodium: 135 mEq/L (ref 135–145)

## 2010-05-17 MED ORDER — ALPRAZOLAM 0.5 MG PO TABS: ORAL_TABLET | ORAL | Status: DC

## 2010-05-17 MED ORDER — LOSARTAN POTASSIUM 50 MG PO TABS: 50.0000 mg | ORAL_TABLET | Freq: Two times a day (BID) | ORAL | Status: DC

## 2010-05-17 NOTE — Progress Notes (Signed)
  Subjective:    Patient ID: [name], female    DOB: Oct 01, 1955, 55 y.o.   MRN: [id number]  HPI Followup, doing much better.  Past Medical History  Diagnosis Date  . Anemia   . Hypertension   . Hypokalemia   . Headache   . Anxiety and depression    Past Surgical History  Procedure Date  . Appendectomy   . Cesarean section   . Tubal ligation   . Bladder surgery      Review of Systems Since the last office visit, she is taking her blood pressure medicine as prescribed. No outpatient BP measurements. She eventually had a CT of the head which was negative. Her headache has improved tremendously, she still have some residual headache in the morning that lasts for a few minutes. The patient believes this morning headache are due to lack of sleep. She reports a lot of stress due to  some problems she has with her daughter. No depression, no anxiety through the day, no suicidal ideas.     Objective:   Physical Exam Alert, oriented x3, Lungs: Clear to auscultation bilaterally Cardiovascular: Regular without rhythm without murmur Extremities: No edema The patient is in no emotional distress although she seems to be worried about her daughter.       Assessment & Plan:

## 2010-05-17 NOTE — Assessment & Plan Note (Signed)
Improving, blood pressure now in the 140s, will increase losartan to 50 mg twice a day. BP goal 120/80 Clear instructions provided in Spanish, she is to let me know if the increase of losartan cause some side effects.

## 2010-05-17 NOTE — Assessment & Plan Note (Signed)
Had a CT of the head done @  [place]. Negative. Headache improving.

## 2010-05-17 NOTE — Assessment & Plan Note (Signed)
Patient is not sleeping well at night, she has a number of issues with her daughter. She is counseled. She does not have much problems during the daytime consequently we will try Xanax at bedtime for now.

## 2010-05-17 NOTE — Patient Instructions (Addendum)
Tome losartan y metoprolol 2 veces al dia  Tome alprazolam 1/2 o 1 tableta en la noche si la necesita para dormir  regrese en 3 meses

## 2010-05-20 ENCOUNTER — Other Ambulatory Visit: Payer: Self-pay

## 2010-05-20 MED ORDER — ALPRAZOLAM 0.5 MG PO TABS: ORAL_TABLET | ORAL | Status: DC

## 2010-05-21 NOTE — Assessment & Plan Note (Signed)
[facility]                          [place] OFFICE NOTE   NAME:[name], [name]                         MRN:          [id number]  DATE:09/28/2005                            DOB:          1955/04/06    CHIEF COMPLAINT:  Multiple.   HISTORY OF PRESENT ILLNESS:  Ms. [name] is a 55 year old Hispanic female  who sees Dr. Lily Peer for her routine female care, and she came here today  with several concerns.  First of all, she has hypertension, and she would  like to have her blood checkup.  Also, she has severe premenstrual headaches  that usually are right-sided, started three days before her period, and they  are occasionally associated with nausea.  These have been consistent for a  few years.  She has a toenail fungal infection.  In the past she was treated  with a topical liquid, which helped on the right side, but the left great  toe is still infected.  She also is concerned about some white patches on  her back.   PAST MEDICAL HISTORY:  1. Hypertension for years.  2. History of anemia.   PAST SURGICAL HISTORY:  1. Appendectomy.  2. C-section.  3. Tubal ligation.  4. Bladder surgery.   FAMILY HISTORY:  1. Father is deceased.  He had an MI at age 67.  2. Mother is also deceased.  3. An aunt has diabetes.  4. A distant relative has breast cancer.  5. No history of colon cancer.   SOCIAL HISTORY:  Does not smoke or drink.  She is married, has 3 children.  She is originally from the [place].   REVIEW OF SYSTEMS:  Denies any chest pain, shortness of breath, no vomiting  or diarrhea.  She admits to occasional nausea without any abdominal pain.  When asked, she also said that for the last 2 years she has seen occasional  rectal bleeding, mostly associated with episodes of constipation.  She also  admits to occasional bilateral ankle swelling, particularly after a long day  at work.  Her periods are every month, they last  5 to 6 days.  The first 3  days are very heavy.   MEDICATIONS:  1. Iron for the last few weeks.  2. Metoprolol HCT 50/25 mg 1 p.o. daily.   ALLERGIES:  No known drug allergies.   PHYSICAL EXAMINATION:  The patient is alert and oriented, in no apparent  distress.  She is 5 feet 3-1/2 inches tall.  She weighs 147 pounds.  Blood pressure  130/80, pulse 60.  NECK:  No thyromegaly.  LUNGS:  Clear to auscultation bilaterally.  CARDIOVASCULAR:  Regular rate and rhythm without a murmur.  ABDOMEN:  Nondistended, soft, good bowel sounds, no organomegaly.  EXTREMITIES:  No edema.  NEUROLOGIC:  Speech, gait and motor are intact.  SKIN:  She has multiple macular hyperchromic lesions that are less than 1 cm  in diameter at the whole back.  Her toenails are normal, except for the left  great toenail, which is dystrophic.   ASSESSMENT AND PLAN:  1.  History of hypertension.  I went ahead and refilled her medications.      We will check a basic metabolic panel.  2. History of anemia.  The patient is 4, she has constipation and      occasionally blood per rectum.  Even though her heavy vaginal bleeding      could account by itself for her anemia, I am still concerned about her      age and occasional rectal bleed, and I would like to rule out other      sources of anemia.  I will go ahead and check a complete blood count      and an iron panel, and I am also going to send her for a      gastrointestinal evaluation.  3. Onychomycosis.  I recommended Penlac q. day for 6 months.  Recommended      to clean that nail with alcohol once a week.  4. The patient has pityriasis alba.  I recommended ketoconazole 2% shampoo      every day for 2 weeks, use for 5 minutes when they take a shower.  5. The patient has premenstrual headaches.  Will try to do prophylactic      treatment with naproxen 500 mg 1 p.o. b.i.d. for the five days prior to      her menstrual periods.  Gastrointestinal side effects were  discussed      with the patient.  Should she have a headache anyway, she was given      samples and a prescription for Frova 2.5 mg 1 p.o. q. two hours p.r.n.,      no more than three pills a day.  6. Office visit in 6 months.  7. I spent more than 30 minutes discussing the plan of care, particularly      about the need of a gastrointestinal evaluation, and how to use      naproxen and Frova for treatment of migraine headaches.       Willow Ora, MD      JP/MedQ  DD:  09/28/2005  DT:  09/29/2005  Job #:  [id number]

## 2010-07-02 ENCOUNTER — Encounter: Payer: Self-pay | Admitting: Internal Medicine

## 2010-07-02 ENCOUNTER — Telehealth: Payer: Self-pay | Admitting: *Deleted

## 2010-07-02 ENCOUNTER — Ambulatory Visit (INDEPENDENT_AMBULATORY_CARE_PROVIDER_SITE_OTHER): Payer: Managed Care, Other (non HMO) | Admitting: Internal Medicine

## 2010-07-02 ENCOUNTER — Telehealth: Payer: Self-pay | Admitting: Internal Medicine

## 2010-07-02 DIAGNOSIS — R413 Other amnesia: Secondary | ICD-10-CM

## 2010-07-02 DIAGNOSIS — F329 Major depressive disorder, single episode, unspecified: Secondary | ICD-10-CM

## 2010-07-02 LAB — CBC WITH DIFFERENTIAL/PLATELET
Basophils Absolute: 0 10*3/uL (ref 0.0–0.1)
Eosinophils Relative: 2.2 % (ref 0.0–5.0)
HCT: 35.5 % — ABNORMAL LOW (ref 36.0–46.0)
Lymphs Abs: 2.6 10*3/uL (ref 0.7–4.0)
MCV: 88.9 fl (ref 78.0–100.0)
Monocytes Absolute: 0.8 10*3/uL (ref 0.1–1.0)
Monocytes Relative: 7 % (ref 3.0–12.0)
Neutrophils Relative %: 65.9 % (ref 43.0–77.0)
Platelets: 188 10*3/uL (ref 150.0–400.0)
RDW: 14 % (ref 11.5–14.6)
WBC: 10.9 10*3/uL — ABNORMAL HIGH (ref 4.5–10.5)

## 2010-07-02 LAB — VITAMIN B12: Vitamin B-12: 408 pg/mL (ref 211–911)

## 2010-07-02 LAB — COMPREHENSIVE METABOLIC PANEL
ALT: 17 U/L (ref 0–35)
AST: 17 U/L (ref 0–37)
Alkaline Phosphatase: 51 U/L (ref 39–117)
Creatinine, Ser: 0.7 mg/dL (ref 0.4–1.2)
Sodium: 140 mEq/L (ref 135–145)
Total Bilirubin: 1.1 mg/dL (ref 0.3–1.2)
Total Protein: 6.8 g/dL (ref 6.0–8.3)

## 2010-07-02 LAB — TSH: TSH: 2.29 u[IU]/mL (ref 0.35–5.50)

## 2010-07-02 LAB — FOLATE: Folate: >24.8 ng/mL (ref >5.9)

## 2010-07-02 NOTE — Assessment & Plan Note (Addendum)
55 year old lady with a history of poorly controlled hypertension (up until recently), recent intense headache with a neg CT (per pt), clinically depressed who presents with a subacute memory loss as reported by the family. Exam is confirmatory. She is afebrile and has a nonfocal examination. DDX for her memory loss is large: metabolic, depression, inflammatory process, doubt  infections, ischemic , etc I discussed the case with Dr. Epimenio Foot , his help is appreciated. He agreed with my plan to do MRI w/w-o, he also suggested blood work. I asked them to see [name] next week. In the meantime, I recommend the family to take her to the emergency room if she has any acute deterioration, fever, severe headache, a rash or any other problems. Will write a letter to her job, she has not been able to work x 1 week, will give additional 2 weeks (until we sort out her memory issues) We'll communicate with her cousin [name] @ [phone] regard to her appointments

## 2010-07-02 NOTE — Progress Notes (Signed)
  Subjective:    Patient ID: [name], female    DOB: [birth year], 55 y.o.   MRN: [id number]  HPI Last OV here  05-17-10, here with 3 family members for evaluation of memory loss. Since the last office visit, the family reports that she was admitted to [facility] ~ June 19 after a syncope, apparently due to to the patient taken several sleeping pills. Since then, her memory has definitely changed, particularly her short-term memory. Her symptoms got acutely worse 5 days ago when she was stopped by police and was issued a ticket  for not having her driver's license updated. It is unclear -per her son- if she was to stop this because she was driving erratically.  Past Medical History  Diagnosis Date  . Anemia   . Hypertension   . Hypokalemia   . Headache   . Anxiety and depression     Past Surgical History  Procedure Date  . Appendectomy   . Cesarean section   . Tubal ligation   . Bladder surgery     Social History: Married x 2 3 kids from [place]--no ETOH--no    Review of Systems Denies any recent slurred speech or focal deficits. She has chronic headaches and they have not been an issue x the last few weeks. Does not check blood pressures at home but her blood pressure today is okay. When asked, she admits to depression and some anxiety, depression related mostly to problems at home, her second husband and her children apparently don't get along well. Her daughter left home ~ 3 weeks ago and only visits sporadically. She admits to some suicidal thoughts but has no plans.  no fever     Objective:   Physical Exam Alert, cooperative, no distress. Neck: Normal carotid pulses. No thyromegaly Lungs CTA B Cardiovascular regular rate and rhythm without murmur. Extremities no edema Neurological exam: Speech is fluent, Face symmetric, external ocular movements intact, pupils equal and reactive to light. Motor exam symmetric, DTRs symmetric. Mental status:  She thinks is 2002, Saturday (today is Friday), can not say the date. She knows her address and her birthday Slightly confused about what she had for dinner last night.        Assessment & Plan:  Today , I spent more than 60 min with the patient, >50% of the time counseling,  reviewing the chart and coordinating her care

## 2010-07-02 NOTE — Assessment & Plan Note (Addendum)
Long  history of depression, obviously worse, had some suicidal ideas but no plans. I discussed w/ the pt at her family SSRIs, they are not enthusiastic about initiation of meds  thus will wait few days, re-asses after she sees neruology See instructions

## 2010-07-02 NOTE — Telephone Encounter (Signed)
Message copied by Leanne Lovely on Fri Jul 02, 2010  2:55 PM ------      Message from: Willow Ora E      Created: Fri Jul 02, 2010  2:12 PM       Refer to [facility] neurology, I spoke with Dr. Epimenio Foot, he agreed to see the patient next week (either him or one of his partners). Fax  my last office visit note to [phone].      communicate with her cousin Sheldon Silvan @ [phone] regard to her appointments

## 2010-07-02 NOTE — Patient Instructions (Signed)
ER if she gets worse, high fever, severe headache, increased depression, suicidal thoughts or strokelike symptoms

## 2010-07-02 NOTE — Telephone Encounter (Signed)
I got records from her recent admission. Admitted 06-14-10 for one day. --Status post accidental overdose of Xanax --Depression, was evaluated by psychiatry and started on Lexapro  (she reportedly is not taking that) --Uncontrolled hypertension, they recommended to increase the losartan to 100 mg (according to my records she was already on such dose) and was recommended to switch from metoprolol to labetalol  ( reportedly she didn't) --CT of the head negative --Chest x-ray negative --Cholesterol 145, HDL 40, LDL 73. --BMP normal, INR normal, T3 and T4 normal. LFTs normal, CBC normal except for hemoglobin of 11.9 --Toxicology profile negative except for benzodiazepines

## 2010-07-03 ENCOUNTER — Other Ambulatory Visit (HOSPITAL_BASED_OUTPATIENT_CLINIC_OR_DEPARTMENT_OTHER): Payer: Managed Care, Other (non HMO)

## 2010-07-03 LAB — RPR

## 2010-07-04 ENCOUNTER — Other Ambulatory Visit: Payer: Managed Care, Other (non HMO)

## 2010-07-04 ENCOUNTER — Ambulatory Visit
Admission: RE | Admit: 2010-07-04 | Discharge: 2010-07-04 | Disposition: A | Discharge status: Home / Self Care | Payer: Managed Care, Other (non HMO) | Source: Ambulatory Visit | Attending: Internal Medicine | Admitting: Internal Medicine

## 2010-07-04 DIAGNOSIS — R413 Other amnesia: Secondary | ICD-10-CM

## 2010-07-04 MED ORDER — GADOBENATE DIMEGLUMINE 529 MG/ML IV SOLN
14.0000 mL | Freq: Once | INTRAVENOUS | Status: AC | PRN
  Administered 2010-07-04: 14 mL via INTRAVENOUS

## 2010-07-05 ENCOUNTER — Telehealth: Payer: Self-pay | Admitting: Internal Medicine

## 2010-07-05 LAB — ANA: Anti Nuclear Antibody(ANA): NEGATIVE

## 2010-07-05 NOTE — Telephone Encounter (Signed)
Bloodwork essentially negative. Brain MRI the she'll as soon acute, nonhemorrhagic stroke. I called the patient, LMOM asked her to call me back. I also called  Her family member, Mrs. Puente:I recommended to start an aspirin 325 mg daily.She has an appointment to see neurology tomorrow advised to keep it. ER if any change in her status. Danielle: Please talk with the neurology nurse at  [facility], send  her all the recent labs and  MRI.  Also let me know if they suggest anything besides start an  aspirin

## 2010-07-05 NOTE — Telephone Encounter (Signed)
THX!

## 2010-07-05 NOTE — Telephone Encounter (Signed)
I spoke w/ [facility] they were going to give the message, i faxed over lab results and MRI.

## 2010-07-06 ENCOUNTER — Other Ambulatory Visit: Payer: Self-pay | Admitting: *Deleted

## 2010-07-06 ENCOUNTER — Telehealth: Payer: Self-pay

## 2010-07-06 MED ORDER — ESCITALOPRAM OXALATE 10 MG PO TABS: 10.0000 mg | ORAL_TABLET | Freq: Every day | ORAL | Status: DC

## 2010-07-06 MED ORDER — LOSARTAN POTASSIUM 50 MG PO TABS: 50.0000 mg | ORAL_TABLET | Freq: Two times a day (BID) | ORAL | Status: DC

## 2010-07-06 NOTE — Telephone Encounter (Signed)
Patient's cousin called(Dania Puente): Patient was seen in the hospital [facility]) and was told to start Labetalol. Patient needs a refill on this med. Patient was recently seen by Dr.Paz on 07/02/2010 and I questioned if she informed Dr.Paz or his assistant that she was started on a new med because it is not currently on her med list.   Patient's cousin states that they probably forgot to tell the Dr. About the newly added med, I gave patients cousin the number to [facility] to call and request for records to be sent and we will go from there. Information ok'd

## 2010-07-06 NOTE — Telephone Encounter (Signed)
Pt family member is calling for refill of the following meds:  Lexapro 10mg  daily, Labetalol 200mg  bid, and Losartan 100mg  daily. She notes that some of these meds were started by the hospital but they did not give the pt rx or refills. They were advised to check with the pharmacy in a couple of hours. Please advise.

## 2010-07-06 NOTE — Telephone Encounter (Signed)
She was recommended Lexapro for depression but elected not to take it. If she likes to start, okay to call Lexapro 10 mg 1 by mouth daily #30 no refills. Family need to watch her closely for suicidal ideas. She was recommended labetalol but she did not start and is now on metoprolol, no need for labetalol. Okay to refill losartan 50 mg twice a day.

## 2010-07-06 NOTE — Telephone Encounter (Signed)
Spoke w/ pts cousin Daina she is aware.

## 2010-07-06 NOTE — Telephone Encounter (Signed)
Pt uses [facility] at [address]. [address] [address].

## 2010-07-08 ENCOUNTER — Other Ambulatory Visit: Payer: Self-pay | Admitting: Gynecology

## 2010-07-08 ENCOUNTER — Encounter (INDEPENDENT_AMBULATORY_CARE_PROVIDER_SITE_OTHER): Payer: Managed Care, Other (non HMO) | Admitting: Gynecology

## 2010-07-08 ENCOUNTER — Other Ambulatory Visit (HOSPITAL_COMMUNITY)
Admission: RE | Admit: 2010-07-08 | Discharge: 2010-07-08 | Disposition: A | Discharge status: Home / Self Care | Payer: Managed Care, Other (non HMO) | Source: Ambulatory Visit | Attending: Gynecology | Admitting: Gynecology

## 2010-07-08 DIAGNOSIS — Z01419 Encounter for gynecological examination (general) (routine) without abnormal findings: Secondary | ICD-10-CM

## 2010-07-08 DIAGNOSIS — N951 Menopausal and female climacteric states: Secondary | ICD-10-CM

## 2010-07-08 DIAGNOSIS — I10 Essential (primary) hypertension: Secondary | ICD-10-CM

## 2010-07-08 DIAGNOSIS — Z124 Encounter for screening for malignant neoplasm of cervix: Secondary | ICD-10-CM | POA: Insufficient documentation

## 2010-07-13 ENCOUNTER — Institutional Professional Consult (permissible substitution): Payer: Managed Care, Other (non HMO) | Admitting: Gynecology

## 2010-07-15 ENCOUNTER — Institutional Professional Consult (permissible substitution): Payer: Managed Care, Other (non HMO) | Admitting: Gynecology

## 2010-07-22 ENCOUNTER — Other Ambulatory Visit: Payer: Self-pay | Admitting: Internal Medicine

## 2010-07-23 ENCOUNTER — Other Ambulatory Visit: Payer: Self-pay | Admitting: Internal Medicine

## 2010-07-23 MED ORDER — METOPROLOL TARTRATE 25 MG PO TABS: 50.0000 mg | ORAL_TABLET | Freq: Two times a day (BID) | ORAL | Status: DC

## 2010-08-13 ENCOUNTER — Telehealth: Payer: Self-pay | Admitting: Internal Medicine

## 2010-08-13 NOTE — Telephone Encounter (Signed)
Scheduled Wednesday 08/18/10 @ 2:30pm for F/U

## 2010-08-13 NOTE — Telephone Encounter (Signed)
Please call pt's  cousin Sheldon Silvan @ [phone] ----> patient is due for a checkup, please arrange

## 2010-08-18 ENCOUNTER — Ambulatory Visit: Payer: Managed Care, Other (non HMO) | Admitting: Internal Medicine

## 2010-08-23 ENCOUNTER — Ambulatory Visit (INDEPENDENT_AMBULATORY_CARE_PROVIDER_SITE_OTHER): Payer: Managed Care, Other (non HMO) | Admitting: Internal Medicine

## 2010-08-23 ENCOUNTER — Encounter: Payer: Self-pay | Admitting: Internal Medicine

## 2010-08-23 DIAGNOSIS — I6350 Cerebral infarction due to unspecified occlusion or stenosis of unspecified cerebral artery: Secondary | ICD-10-CM

## 2010-08-23 DIAGNOSIS — R413 Other amnesia: Secondary | ICD-10-CM

## 2010-08-23 DIAGNOSIS — F419 Anxiety disorder, unspecified: Secondary | ICD-10-CM

## 2010-08-23 DIAGNOSIS — I10 Essential (primary) hypertension: Secondary | ICD-10-CM

## 2010-08-23 DIAGNOSIS — I639 Cerebral infarction, unspecified: Secondary | ICD-10-CM

## 2010-08-23 DIAGNOSIS — F341 Dysthymic disorder: Secondary | ICD-10-CM

## 2010-08-23 DIAGNOSIS — F32A Depression, unspecified: Secondary | ICD-10-CM

## 2010-08-23 MED ORDER — CHLORTHALIDONE 25 MG PO TABS: 25.0000 mg | ORAL_TABLET | Freq: Every day | ORAL | Status: DC

## 2010-08-23 MED ORDER — ESCITALOPRAM OXALATE 10 MG PO TABS: 10.0000 mg | ORAL_TABLET | Freq: Every day | ORAL | Status: DC

## 2010-08-23 NOTE — Assessment & Plan Note (Signed)
Improving.

## 2010-08-23 NOTE — Patient Instructions (Addendum)
Start chlorthalidone every morning to get your blood pressure under better control, continue taking all other medications. Come back in 2 weeks for blood work only: BMP---dx  Hypertension, FLP---dx  Hyperlipidemia Check your blood pressures daily If your blood pressure is more than 140/85 or  less than 110/60: let me know

## 2010-08-23 NOTE — Assessment & Plan Note (Addendum)
Although she reports good compliance with medication and better ambulatory BPs,  her blood pressure today is elevated. Add chlorthalidone

## 2010-08-23 NOTE — Progress Notes (Signed)
  Subjective:    Patient ID: [name], female    DOB: 10-02-1955, 55 y.o.   MRN: [id number]  HPI follow up Stroke--Note from neurology reviewed , theyordered  a workup, aspirin 325 (patient is taking 81 mg only). HTN--reports good medication compliance, neurology recommended labetalol but she is still taking metoprolol. Ambulatory blood pressure within normal per patient Anxiety--she was taking Lexapro up to last week, she ran out, in general it helped. No side effects  Past Medical History  Diagnosis Date  . Anemia   . Hypertension   . Hypokalemia   . Headache   . Anxiety and depression     admited w/ xanax overdose 6-12, Rx lexapro per psych  . CVA (cerebral infarction)     per MRI 07-2010   Past Surgical History  Procedure Date  . Appendectomy   . Cesarean section   . Tubal ligation   . Bladder surgery      Review of Systems Denies any chest pain or shortness of breath She is back working, memory is improving,  still not 100% back. See physical exam. Still having issues with her daughter which seems to be what is making her anxious and depressed     Objective:   Physical Exam  Constitutional: She appears well-developed and well-nourished. No distress.  HENT:  Head: Normocephalic and atraumatic.  Cardiovascular: Normal rate, regular rhythm and normal heart sounds.   No murmur heard. Pulmonary/Chest: Breath sounds normal. No respiratory distress. She has no wheezes. She has no rales.  Abdominal: Soft. Bowel sounds are normal.  Neurological:       Alert, today she is oriented to time and space, she remembers seeing me before, knows her phone number. Face is symmetric, speech and gait are normal.  Skin: She is not diaphoretic.  Psychiatric: She has a normal mood and affect. Her behavior is normal. Judgment and thought content normal.          Assessment & Plan:

## 2010-08-23 NOTE — Assessment & Plan Note (Signed)
History of admission to the hospital with Xanax overdose 06/2010,  Took Lexapro up to a week ago then she ran out but it was working for her. RF  Lexapro

## 2010-08-23 NOTE — Assessment & Plan Note (Addendum)
Recently diagnosed with a stroke, saw neurology, they did order a number of tests. Unclear if they were done, patient reports that she is having problems affording further testing. Plan: Recommend to discussed with the neurology office cost of testing, it is very important that we get all the workup done. Increase aspirin to 325 mg as recommended by neurology Will try to control her blood pressure better, see hypertension We will recheck her cholesterol, see instructions

## 2010-08-30 ENCOUNTER — Ambulatory Visit: Payer: Managed Care, Other (non HMO) | Admitting: Internal Medicine

## 2010-09-02 ENCOUNTER — Other Ambulatory Visit: Payer: Self-pay | Admitting: Internal Medicine

## 2010-09-02 DIAGNOSIS — E785 Hyperlipidemia, unspecified: Secondary | ICD-10-CM

## 2010-09-02 DIAGNOSIS — I10 Essential (primary) hypertension: Secondary | ICD-10-CM

## 2010-09-03 ENCOUNTER — Other Ambulatory Visit: Payer: Managed Care, Other (non HMO)

## 2010-09-13 ENCOUNTER — Ambulatory Visit: Payer: Managed Care, Other (non HMO) | Admitting: Internal Medicine

## 2010-10-04 ENCOUNTER — Ambulatory Visit: Payer: Managed Care, Other (non HMO) | Admitting: Internal Medicine

## 2010-10-04 ENCOUNTER — Encounter: Payer: Self-pay | Admitting: Internal Medicine

## 2010-10-04 DIAGNOSIS — Z0289 Encounter for other administrative examinations: Secondary | ICD-10-CM

## 2010-11-26 ENCOUNTER — Other Ambulatory Visit: Payer: Self-pay | Admitting: Internal Medicine

## 2010-12-31 ENCOUNTER — Other Ambulatory Visit: Payer: Self-pay | Admitting: Internal Medicine

## 2011-02-08 ENCOUNTER — Other Ambulatory Visit: Payer: Self-pay | Admitting: Internal Medicine

## 2011-02-08 NOTE — Telephone Encounter (Signed)
Refill done.  

## 2011-05-25 ENCOUNTER — Other Ambulatory Visit: Payer: Self-pay | Admitting: Internal Medicine

## 2011-05-25 NOTE — Telephone Encounter (Signed)
Refill done.  

## 2011-09-17 ENCOUNTER — Telehealth: Payer: Self-pay | Admitting: Internal Medicine

## 2011-09-17 DIAGNOSIS — I10 Essential (primary) hypertension: Secondary | ICD-10-CM

## 2011-09-17 DIAGNOSIS — E785 Hyperlipidemia, unspecified: Secondary | ICD-10-CM

## 2011-09-17 NOTE — Telephone Encounter (Signed)
Was seen 08-2011, recommended chlorthalidone. Also recommended blood work. Please check on the patient, medication compliance?. Arrange labs:  BMP---dx Hypertension, FLP---dx Hyperlipidemia

## 2011-09-20 NOTE — Telephone Encounter (Signed)
LMOVM for pt to return call 

## 2011-09-23 NOTE — Telephone Encounter (Signed)
Lab appt scheduled & orders entered.

## 2011-09-27 ENCOUNTER — Other Ambulatory Visit: Payer: Managed Care, Other (non HMO)

## 2011-09-28 ENCOUNTER — Other Ambulatory Visit (INDEPENDENT_AMBULATORY_CARE_PROVIDER_SITE_OTHER): Payer: Managed Care, Other (non HMO)

## 2011-09-28 ENCOUNTER — Other Ambulatory Visit: Payer: Self-pay | Admitting: Internal Medicine

## 2011-09-28 DIAGNOSIS — I10 Essential (primary) hypertension: Secondary | ICD-10-CM

## 2011-09-28 DIAGNOSIS — E785 Hyperlipidemia, unspecified: Secondary | ICD-10-CM

## 2011-09-29 LAB — LIPID PANEL
Cholesterol: 149 mg/dL (ref 0–200)
LDL Cholesterol: 71 mg/dL (ref 0–99)

## 2011-09-29 LAB — BASIC METABOLIC PANEL
BUN: 21 mg/dL (ref 6–23)
Creatinine, Ser: 0.9 mg/dL (ref 0.4–1.2)
GFR: 73.02 mL/min (ref >60.00)

## 2011-09-29 NOTE — Telephone Encounter (Signed)
Refill done.  

## 2011-10-04 ENCOUNTER — Encounter: Payer: Self-pay | Admitting: *Deleted

## 2011-10-10 ENCOUNTER — Ambulatory Visit (INDEPENDENT_AMBULATORY_CARE_PROVIDER_SITE_OTHER): Payer: Managed Care, Other (non HMO) | Admitting: Gynecology

## 2011-10-10 ENCOUNTER — Encounter: Payer: Self-pay | Admitting: Gynecology

## 2011-10-10 VITALS — BP 140/88 | Ht 62.5 in | Wt 158.0 lb

## 2011-10-10 DIAGNOSIS — Z01419 Encounter for gynecological examination (general) (routine) without abnormal findings: Secondary | ICD-10-CM

## 2011-10-10 DIAGNOSIS — N6320 Unspecified lump in the left breast, unspecified quadrant: Secondary | ICD-10-CM

## 2011-10-10 DIAGNOSIS — Z23 Encounter for immunization: Secondary | ICD-10-CM

## 2011-10-10 DIAGNOSIS — N852 Hypertrophy of uterus: Secondary | ICD-10-CM

## 2011-10-10 DIAGNOSIS — N63 Unspecified lump in unspecified breast: Secondary | ICD-10-CM

## 2011-10-10 NOTE — Progress Notes (Signed)
[name] 1955/10/28 [id number]   History:    56 y.o.  for annual gyn exam  With no complaints today. Review of her record indicated she has a history of cerebrovascular accident (cerebral infarction) in the past. Patient has been doing well otherwise she is been seen Dr. Drue Novel her primary physician who has done her lab work. Patient would know prior colonoscopy. She frequently does her self breast examination and her mammogram was in 2008. Patient with prior history of endometrial ablation. Cycles are regular light. Several years ago she had a bladder suspension in the [place]. Patient would know prior history of abnormal Pap smears.  Past medical history,surgical history, family history and social history were all reviewed and documented in the EPIC chart.  Gynecologic History Patient's last menstrual period was 10/03/2011. Contraception: tubal ligation Last Pap: 2012. Results were: normal Last mammogram: 2008. Results were: normal  Obstetric History OB History    Grav Para Term Preterm Abortions TAB SAB Ect Mult Living   5 3 3  2  2   3      # Outc Date GA Lbr Len/2nd Wgt Sex Del Anes PTL Lv   1 TRM     F SVD  No Yes   2 TRM     M SVD  No Yes   3 TRM     F CS  No Yes   4 SAB            5 SAB                ROS: A ROS was performed and pertinent positives and negatives are included in the history.  GENERAL: No fevers or chills. HEENT: No change in vision, no earache, sore throat or sinus congestion. NECK: No pain or stiffness. CARDIOVASCULAR: No chest pain or pressure. No palpitations. PULMONARY: No shortness of breath, cough or wheeze. GASTROINTESTINAL: No abdominal pain, nausea, vomiting or diarrhea, melena or bright red blood per rectum. GENITOURINARY: No urinary frequency, urgency, hesitancy or dysuria. MUSCULOSKELETAL: No joint or muscle pain, no back pain, no recent trauma. DERMATOLOGIC: No rash, no itching, no lesions. ENDOCRINE: No polyuria, polydipsia, no heat or cold  intolerance. No recent change in weight. HEMATOLOGICAL: No anemia or easy bruising or bleeding. NEUROLOGIC: No headache, seizures, numbness, tingling or weakness. PSYCHIATRIC: No depression, no loss of interest in normal activity or change in sleep pattern.     Exam: chaperone present  BP 140/88  Ht 5' 2.5" (1.588 m)  Wt 158 lb (71.668 kg)  BMI 28.44 kg/m2  LMP 10/03/2011  Body mass index is 28.44 kg/(m^2).  General appearance : Well developed well nourished female. No acute distress HEENT: Neck supple, trachea midline, no carotid bruits, no thyroidmegaly Lungs: Clear to auscultation, no rhonchi or wheezes, or rib retractions  Heart: Regular rate and rhythm, no murmurs or gallops Breast:Examined in sitting and supine position were symmetrical in appearance, no palpable masses or tenderness on the right breast that on the left breast at the 2:00 position one finger breath from the areolar region there is a multi-nodular area noted which was tender approximately 1-1/2-2 cm size. no skin retraction, no nipple inversion, no nipple discharge, no skin discoloration, no axillary or supraclavicular lymphadenopathy Abdomen: no palpable masses or tenderness, no rebound or guarding Extremities: no edema or skin discoloration or tenderness  Pelvic:  Bartholin, Urethra, Skene Glands: Within normal limits             Vagina: No gross lesions  or discharge  Cervix: No gross lesions or discharge  Uterus  enlarged and retroverted  Adnexa  Without masses or tenderness  Anus and perineum  normal   Rectovaginal  normal sphincter tone without palpated masses or tenderness             Hemoccult cards will be presented to the patient to submit to the office for testing     Assessment/Plan:  56 y.o. female for annual exam who was noted to have an enlarged retroverted uterus today. She'll return back to the office next week for a pelvic ultrasound. At time of her breast examination incidental finding was  noted at the 2:00 position of the left breast one finger breath from the areolar region there was a multicystic tender area approximately 1-1/2-2 cm in size. She will be sent to the radiology department for a diagnostic mammogram and further workup. She will be given the name of the gastroenterologist in the community for her to schedule colonoscopy. She will be reminded to submit to the office Hemoccult cards for testing. She received a dTap Vaccine today. No labs were drawn today due to the fact her primary physician has drawn recently. We did discuss the new Pap smear screening guidelines and she will not need one this year.    Ok Edwards MD, 5:21 PM 10/10/2011

## 2011-10-10 NOTE — Patient Instructions (Addendum)
Vacuna contra la difteria, el ttanos, y la tos ferina Lo que usted necesita saber (Diphtheria, Tetanus, and Pertussis [DTaP] Vaccine) POR QU VACUNARSE? La difteria, el ttano y la tos [place] Spar son enfermedades graves provocadas por bacterias. La difteria y la tos Benetta Spar se [place] de persona a Social worker. El ttano ingresa al cuerpo a travs de cortes o heridas. La difteria produce un recubrimiento denso en la parte trasera de la garganta.  Puede producir problemas para respirar, parlisis, insuficiencia cardaca, e incluso la muerte. El ttanos causa contracturas dolorosas de los msculos, a menudo en todo el cuerpo.  Puede ocasionar un "bloqueo" de la [place], de modo que es imposible abrir la boca o tragar. El ttanos produce la muerte en alrededor de 2 cada 10 casos. La tos ferina produce ataques de tos tan fuertes que, en nios, imposibilita comer, beber o respirar. Estos ataques pueden durar semanas.  Puede producir neumona, convulsiones (ataques de espasmos o ausencias), dao cerebral, y la muerte. La vacuna para la difteria, el ttano y la tos [place] (DTPa) puede ayudar a Market researcher. La mayor parte de los nios que la reciben estarn protegidos durante toda su niez. Muchos ms nios padeceran estas enfermedades si no fueran vacunados. La DTPa es una versin ms segura de una vacuna anterior denominada DTP. La DTP se ha dejado de [employer]. QUIN DEBE RECIBIR ESTA VACUNA Y CUNDO? Los nios deberan recibir 5 dosis de la vacuna DTPa, una dosis en cada una de las siguientes edades:  2 meses.  4 meses.  6 meses.  15 a 18 meses.  4 a 6 aos. La vacuna DTPa puede darse en simultneo con otras vacunas. ALGUNOS NIOS NO DEBERAN DARSE LA VACUNA DTPA O DEBERAN ESPERAR  Aquellos nios con trastornos menores, tales como resfros, pueden ser vacunados, pero aquellos con trastornos moderados a graves deberan esperar hasta su recuperacin para  recibir la vacuna DTPa.  Cualquier nio que haya tenido una reaccin alrgica grave luego de una dosis de DTPa no debera recibir otra dosis.  Cualquier nio que haya sufrido una enfermedad cerebral o del sistema nervioso luego de [address]  [address] de haber recibido una dosis de la vacuna DTPa, no debera recibir otra dosis.  Hable con el mdico si el nio:  Ha tenido convulsiones o sufri un colapso luego de una dosis de DTPa.  Ha llorado sin parar durante 3 horas o ms luego de una dosis de DTPa.  Ha tenido fiebre mayor a 105 F (40.6 C) luego de una dosis de DTPa.  Pida ms informacin al profesional que lo asiste. Algunos de estos nios podrn recibir una vacuna que no protege para la tos [place], [place] DT. NIOS DE MAYOR EDAD Y ADULTOS  La vacuna DTPa no se administra en adolescentes, adultos, o nios mayores a los 7 aos de [place].  Sin embargo, Therapist, art an requieren proteccin. Existe una vacuna llamada Tdap, que es similar a la DTPa. Se recomienda una dosis nica de Tdap en personas desde los 11 a los 64 aos de [place]. Otra vacuna, llamada Td, provee proteccin contra el ttanos y la difteria, pero no contra la tos [place]. Se recomienda su aplicacin cada 10 aos. CULES SON LOS RIESGOS DE LA VACUNA DTPA?  Enfermarse de difteria, ttanos o pertusis es mucho ms peligroso que recibir la vacuna DTPa.  Sin embargo, una [place], como cualquier otro medicamento, puede causar problemas serios, como Therapist, art graves. El riesgo de que la vacuna DTPa cause daos  graves o la muerte es extremadamente pequeo. Problemas leves (comunes)  Fiebre (en hasta 1 de cada 4 nios).  Enrojecimiento o inflamacin en el lugar en el que se dio la inyeccin (en hasta 1 de cada 4 nios).  Dolor o sensibilidad en Immunologist en el que se dio la inyeccin (en hasta 1 de cada 4 nios). Estos problemas ocurren ms a menudo luego de la cuarta y [place] dosis de vacuna DTPa que en las dosis anteriores. En  ocasiones luego de la cuarta o quinta dosis se observa la inflamacin de la pierna o brazo completo en que se ha dado la inyeccin, y puede durar de 1 a 7 das (en hasta 1 nio de cada 30). Otros problemas leves incluyen:  Irritabilidad (en hasta 1 de cada 3 nios).  Cansancio o falta de apetito (en hasta 1 de cada 10 nios).  Vmitos (en hasta 1 de cada 50 nios). Estos problemas ocurren generalmente de 1 a 3 das luego de la inyeccin. Problemas moderados (poco frecuentes)  Convulsiones (sacudones o fijacin de la mirada) (en hasta 1 de cada 14.000 nios).  Llanto sin parar durante 3 horas o ms (en hasta 1 nio de cada 1.000).  Fiebre alta, mayor a 105 F (40.6 C) (alrededor de 1 nio cada 16.000). Problemas graves (muy raros)  Automotive engineer grave (menos de 1 por milln de dosis).  Se han informado varios otros problemas graves luego de la aplicacin de la vacuna DTPa. Estos incluyen:  Convulsiones a largo plazo, coma, o reduccin de la conciencia.  Dao permanente al cerebro. Estos son casos tan poco frecuentes que resulta difcil saber si fueron provocados por la vacuna. Controlar la fiebre es particularmente importante para los nios que han tenido convulsiones, por cualquier motivo. Tambin es importante si otro miembro de la familia ha tenido convulsiones. Puede reducir la fiebre y el dolor dando al nio un analgsico sin aspirina al recibir la vacuna, y durante las siguientes 24 horas, segn las instrucciones del [place]. QU PASA SI HAY UNA REACCIN MODERADA O GRAVE? A qu debo prestar atencin? Cualquier cosa extraa o poco comn, como una reaccin alrgica, fiebre alta o comportamiento extrao. Es muy poco comn que ocurran reacciones alrgicas graves con cualquier vacuna. Si se produjera una, sera dentro de los primeros minutos hasta algunas horas luego de la inyeccin. Podr observar dificultad para respirar, ronquera o silbidos al respirar, ronchas, palidez,  debilidad, frecuencia cardaca elevada, o mareos. Si ocurrieran fiebre o convulsiones, normalmente sera dentro de la primera semana luego de la inyeccin. Qu debo hacer?  Comunquese con el mdico o lleve inmediatamente a la persona a un mdico.  Diga al mdico lo que ocurri, la fecha y hora en que ocurri, y cundo recibi la vacuna.  Pida al mdico, enfermera, o al servicio de salud que complete el informe [employer] efectos adversos de la vacuna (Vaccine Adverse Event Reporting System, VAERS). O, bien puede completar el informe a travs del sitio web de VAERS en www.vaers.LAgents.no o llamando al 951-776-9606. VAERS no proporciona consejos mdicos. EL PROGRAMA NACIONAL DE COMPENSACIN POR LESIONES CAUSADAS POR VACUNAS (NATIONAL VACCINE INJURY COMPENSATION PROGRAM)  En el raro caso en que usted o su hijo hayan tenido una reaccin grave a Cathleen Corti, se ha creado un programa federal para ayudarlo a Network engineer atencin de los lesionados.  Para obtener detalles acerca del [place] de Compensacin por Lesiones Causadas por [place], llame al [phone] o visite el sitio web del programa en SpiritualWord.at  CMO OBTENER MS INFORMACIN?  Consulte con el profesional que lo asiste. Podr darle el prospecto de la vacuna o sugerirle otras fuentes de informacin.  Llame al programa de vacunacin del departamento de salud local o estatal.  Comunquese con los Centers for [employer] and Prevention (Centros para el control y la prevencin de enfermedades, CDC).  Llame al (434) [phone] (1-800-CDC-INFO).  Visite el sitio web del [employer] de [place], en PicCapture.uy CDC Diphtheria, Tetanus, and Pertussis-Spanish VIS (05/19/05) Document Released: 03/18/2008 Document Revised: 03/14/2011 [facility] Patient Information 2013 [place], [place].

## 2011-10-11 ENCOUNTER — Encounter: Payer: Self-pay | Admitting: Gynecology

## 2011-10-11 ENCOUNTER — Other Ambulatory Visit: Payer: Self-pay | Admitting: Gynecology

## 2011-10-11 ENCOUNTER — Telehealth: Payer: Self-pay | Admitting: *Deleted

## 2011-10-11 DIAGNOSIS — N6320 Unspecified lump in the left breast, unspecified quadrant: Secondary | ICD-10-CM

## 2011-10-11 NOTE — Telephone Encounter (Signed)
Message copied by Aura Camps on Tue Oct 11, 2011  9:07 AM ------      Message from: Ok Edwards      Created: Mon Oct 10, 2011  5:29 PM       Please schedule a diagnostic mammogram for this patient on her left breast with the following findings:             At time of her breast examination incidental finding was noted at the 2:00 position of the left breast one finger breath from the areolar region there was a multicystic tender area approximately 1-1/2-2 cm in size.

## 2011-10-11 NOTE — Addendum Note (Signed)
Addended by: Bertram Savin A on: 10/11/2011 09:05 AM   Modules accepted: Orders

## 2011-10-11 NOTE — Telephone Encounter (Signed)
Order placed at breast center. 

## 2011-10-18 ENCOUNTER — Encounter: Payer: Self-pay | Admitting: Gynecology

## 2011-10-18 NOTE — Telephone Encounter (Signed)
Left message for pt to contact breast center regarding the below

## 2011-10-21 ENCOUNTER — Ambulatory Visit: Payer: Managed Care, Other (non HMO) | Admitting: Gynecology

## 2011-10-21 ENCOUNTER — Other Ambulatory Visit: Payer: Managed Care, Other (non HMO)

## 2011-11-07 ENCOUNTER — Encounter: Payer: Managed Care, Other (non HMO) | Admitting: Internal Medicine

## 2011-11-07 DIAGNOSIS — Z0289 Encounter for other administrative examinations: Secondary | ICD-10-CM

## 2011-11-08 NOTE — Telephone Encounter (Signed)
Breast center left message and I left message for pt regarding appointment. Pt never called back to breast center nor office.

## 2011-11-11 ENCOUNTER — Ambulatory Visit (INDEPENDENT_AMBULATORY_CARE_PROVIDER_SITE_OTHER): Payer: Managed Care, Other (non HMO) | Admitting: Family Medicine

## 2011-11-11 ENCOUNTER — Encounter: Payer: Self-pay | Admitting: Family Medicine

## 2011-11-11 VITALS — BP 150/100 | HR 80 | Temp 97.9°F | Resp 16 | Wt 155.1 lb

## 2011-11-11 DIAGNOSIS — I10 Essential (primary) hypertension: Secondary | ICD-10-CM

## 2011-11-11 MED ORDER — LOSARTAN POTASSIUM-HCTZ 100-12.5 MG PO TABS: 1.0000 | ORAL_TABLET | Freq: Every day | ORAL | Status: DC

## 2011-11-11 MED ORDER — METOPROLOL SUCCINATE ER 50 MG PO TB24: 50.0000 mg | ORAL_TABLET | Freq: Every day | ORAL | Status: DC

## 2011-11-11 NOTE — Patient Instructions (Addendum)
Follow up in 1 month to recheck blood pressure STOP the Losartan and Metoprolol START the Losartan/Hydrochlorothiazide combo pill once daily START the Metoprolol 50mg  daily Please call with any questions or concerns Hang in there!!!

## 2011-11-11 NOTE — Progress Notes (Signed)
  Subjective:    Patient ID: [name], female    DOB: 04-20-1955, 56 y.o.   MRN: [id number]  HPI HTN- pt reports HA x3-4 days and 'feeling different'.  Has been taking metoprolol once daily rather than twice.  Still taking Losartan.  Not on Chlorthalidone- pt doesn't know what this medication is.  No CP, SOB, visual changes, edema.  + fatigue.  Hx of CVA.   Review of Systems For ROS see HPI     Objective:   Physical Exam  Vitals reviewed. Constitutional: She is oriented to person, place, and time. She appears well-developed and well-nourished. No distress.  HENT:  Head: Normocephalic and atraumatic.  Eyes: Conjunctivae normal and EOM are normal. Pupils are equal, round, and reactive to light.  Neck: Normal range of motion. Neck supple. No thyromegaly present.  Cardiovascular: Normal rate, regular rhythm, normal heart sounds and intact distal pulses.   No murmur heard. Pulmonary/Chest: Effort normal and breath sounds normal. No respiratory distress.  Abdominal: Soft. She exhibits no distension. There is no tenderness.  Musculoskeletal: She exhibits no edema.  Lymphadenopathy:    She has no cervical adenopathy.  Neurological: She is alert and oriented to person, place, and time.  Skin: Skin is warm and dry.  Psychiatric: She has a normal mood and affect. Her behavior is normal.          Assessment & Plan:

## 2011-11-11 NOTE — Assessment & Plan Note (Signed)
Deteriorated.  BP not well controlled- having HAs.  Pt not on diuretic.  Will increase Cozaar dose and add HCTZ.  Will also switch to long acting metoprolol since pt is not taking medication correctly.  Reviewed proper use of meds.  Reviewed supportive care and red flags that should prompt return.  Pt expressed understanding and is in agreement w/ plan.

## 2011-12-09 ENCOUNTER — Ambulatory Visit: Payer: Managed Care, Other (non HMO) | Admitting: Internal Medicine

## 2011-12-09 DIAGNOSIS — Z0289 Encounter for other administrative examinations: Secondary | ICD-10-CM

## 2011-12-20 ENCOUNTER — Ambulatory Visit: Payer: Managed Care, Other (non HMO) | Admitting: Internal Medicine

## 2011-12-20 DIAGNOSIS — Z0289 Encounter for other administrative examinations: Secondary | ICD-10-CM

## 2012-02-23 ENCOUNTER — Emergency Department (HOSPITAL_BASED_OUTPATIENT_CLINIC_OR_DEPARTMENT_OTHER): Payer: BC Managed Care – PPO

## 2012-02-23 ENCOUNTER — Emergency Department (HOSPITAL_BASED_OUTPATIENT_CLINIC_OR_DEPARTMENT_OTHER)
Admission: EM | Admit: 2012-02-23 | Discharge: 2012-02-23 | Disposition: A | Discharge status: Home / Self Care | Payer: BC Managed Care – PPO | Attending: Emergency Medicine | Admitting: Emergency Medicine

## 2012-02-23 ENCOUNTER — Encounter (HOSPITAL_BASED_OUTPATIENT_CLINIC_OR_DEPARTMENT_OTHER): Payer: Self-pay | Admitting: *Deleted

## 2012-02-23 DIAGNOSIS — I10 Essential (primary) hypertension: Secondary | ICD-10-CM | POA: Insufficient documentation

## 2012-02-23 DIAGNOSIS — Z8673 Personal history of transient ischemic attack (TIA), and cerebral infarction without residual deficits: Secondary | ICD-10-CM | POA: Insufficient documentation

## 2012-02-23 DIAGNOSIS — R269 Unspecified abnormalities of gait and mobility: Secondary | ICD-10-CM | POA: Insufficient documentation

## 2012-02-23 DIAGNOSIS — R110 Nausea: Secondary | ICD-10-CM | POA: Insufficient documentation

## 2012-02-23 DIAGNOSIS — Z79899 Other long term (current) drug therapy: Secondary | ICD-10-CM | POA: Insufficient documentation

## 2012-02-23 DIAGNOSIS — Z791 Long term (current) use of non-steroidal anti-inflammatories (NSAID): Secondary | ICD-10-CM | POA: Insufficient documentation

## 2012-02-23 DIAGNOSIS — Z862 Personal history of diseases of the blood and blood-forming organs and certain disorders involving the immune mechanism: Secondary | ICD-10-CM | POA: Insufficient documentation

## 2012-02-23 DIAGNOSIS — B9789 Other viral agents as the cause of diseases classified elsewhere: Secondary | ICD-10-CM | POA: Insufficient documentation

## 2012-02-23 DIAGNOSIS — Z7982 Long term (current) use of aspirin: Secondary | ICD-10-CM | POA: Insufficient documentation

## 2012-02-23 DIAGNOSIS — F341 Dysthymic disorder: Secondary | ICD-10-CM | POA: Insufficient documentation

## 2012-02-23 DIAGNOSIS — B349 Viral infection, unspecified: Secondary | ICD-10-CM

## 2012-02-23 DIAGNOSIS — R509 Fever, unspecified: Secondary | ICD-10-CM | POA: Insufficient documentation

## 2012-02-23 DIAGNOSIS — R05 Cough: Secondary | ICD-10-CM | POA: Insufficient documentation

## 2012-02-23 DIAGNOSIS — Z8639 Personal history of other endocrine, nutritional and metabolic disease: Secondary | ICD-10-CM | POA: Insufficient documentation

## 2012-02-23 DIAGNOSIS — R059 Cough, unspecified: Secondary | ICD-10-CM | POA: Insufficient documentation

## 2012-02-23 MED ORDER — HYDROCODONE-ACETAMINOPHEN 5-325 MG PO TABS
1.0000 | ORAL_TABLET | Freq: Once | ORAL | Status: AC
  Administered 2012-02-23: 1 via ORAL
  Filled 2012-02-23: qty 1

## 2012-02-23 MED ORDER — HYDROCODONE-ACETAMINOPHEN 5-325 MG PO TABS: 2.0000 | ORAL_TABLET | ORAL | Status: DC | PRN

## 2012-02-23 MED ORDER — IBUPROFEN 800 MG PO TABS
800.0000 mg | ORAL_TABLET | Freq: Once | ORAL | Status: AC
  Administered 2012-02-23: 800 mg via ORAL
  Filled 2012-02-23: qty 1

## 2012-02-23 MED ORDER — IBUPROFEN 800 MG PO TABS: 800.0000 mg | ORAL_TABLET | Freq: Three times a day (TID) | ORAL | Status: DC

## 2012-02-23 MED ORDER — IBUPROFEN 800 MG PO TABS: 800.0000 mg | ORAL_TABLET | Freq: Once | ORAL | Status: DC

## 2012-02-23 MED ORDER — HYDROCODONE-ACETAMINOPHEN 5-325 MG PO TABS: 1.0000 | ORAL_TABLET | Freq: Once | ORAL | Status: DC

## 2012-02-23 NOTE — ED Notes (Signed)
Pt amb to room 2 with quick steady gait in nad. Pt reports 2 days of fevers, cough, body aches, pt states she has nausea but is unable to vomit.

## 2012-02-23 NOTE — ED Provider Notes (Signed)
History     CSN: [id number]  Arrival date & time 02/23/12  1250   First MD Initiated Contact with Patient 02/23/12 1318      Chief Complaint  Patient presents with  . Generalized Body Aches  . Cough  . Fever    (Consider location/radiation/quality/duration/timing/severity/associated sxs/prior treatment) Patient is a 57 y.o. female presenting with cough and fever. The history is provided by the patient. No language interpreter was used.  Cough Cough characteristics:  Productive and non-productive Sputum characteristics:  Clear Severity:  Moderate Onset quality:  Gradual Duration:  2 days Timing:  Constant Progression:  Worsening Chronicity:  New Smoker: no   Relieved by:  Nothing Worsened by:  Nothing tried Associated symptoms: fever   Fever Associated symptoms: cough   Pt complains of a cough and headache.  Pt complains of body aches and fever.   Pt did not have a flu shot  Past Medical History  Diagnosis Date  . Anemia   . Hypertension   . Hypokalemia   . Headache   . Anxiety and depression     admited w/ xanax overdose 6-12, Rx lexapro per psych  . CVA (cerebral infarction)     per MRI 07-2010  . NSVD (normal spontaneous vaginal delivery)     X3  . Missed ab     Past Surgical History  Procedure Laterality Date  . Appendectomy    . Cesarean section    . Tubal ligation    . Bladder surgery    . Bladder suspension      DONE IN [facility]  . Endometrial ablation      HER OPTION    Family History  Problem Relation Age of Onset  . Heart attack Father 48  . Diabetes      aunt  . Hypertension      many family members  . Colon cancer Neg Hx   . Breast cancer      distant relative     History  Substance Use Topics  . Smoking status: Never Smoker   . Smokeless tobacco: Never Used  . Alcohol Use: No    OB History   Grav Para Term Preterm Abortions TAB SAB Ect Mult Living   5 3 3  2  2   3       Review of Systems  Constitutional: Positive  for fever.  Respiratory: Positive for cough.   All other systems reviewed and are negative.    Allergies  Lisinopril  Home Medications   Current Outpatient Rx  Name  Route  Sig  Dispense  Refill  . aspirin 325 MG tablet   Oral   Take 325 mg by mouth daily.           Marland Kitchen EXPIRED: escitalopram (LEXAPRO) 10 MG tablet   Oral   Take 1 tablet (10 mg total) by mouth daily.   30 tablet   3   . ibuprofen (ADVIL,MOTRIN) 800 MG tablet   Oral   Take 800 mg by mouth every 8 (eight) hours as needed.           Marland Kitchen losartan-hydrochlorothiazide (HYZAAR) 100-12.5 MG per tablet   Oral   Take 1 tablet by mouth daily.   30 tablet   3   . metoprolol succinate (TOPROL-XL) 50 MG 24 hr tablet   Oral   Take 1 tablet (50 mg total) by mouth daily. Take with or immediately following a meal.   30 tablet   3  BP 172/102  Pulse 80  Temp(Src) 98.9 F (37.2 C) (Oral)  Resp 18  SpO2 97%  LMP 02/18/2012  Physical Exam  Nursing note and vitals reviewed. Constitutional: She is oriented to person, place, and time. She appears well-developed and well-nourished.  HENT:  Head: Normocephalic and atraumatic.  Right Ear: External ear normal.  Left Ear: External ear normal.  Eyes: Pupils are equal, round, and reactive to light.  Neck: Normal range of motion. Neck supple.  Cardiovascular: Normal rate, regular rhythm and normal heart sounds.   Pulmonary/Chest: Effort normal and breath sounds normal.  Abdominal: Soft.  Musculoskeletal: Normal range of motion.  Neurological: She is alert and oriented to person, place, and time.  Skin: Skin is warm.  Psychiatric: She has a normal mood and affect.    ED Course  Procedures (including critical care time)  Labs Reviewed - No data to display Dg Chest 2 View  02/23/2012  *RADIOLOGY REPORT*  Clinical Data: Fever, cough and body aches.  CHEST - 2 VIEW  Comparison: None.  Findings: Trachea is midline.  Heart size normal.  Lungs are clear. No pleural  fluid.  IMPRESSION: No acute findings.   Original Report Authenticated By: Leanna Battles, M.D.      No diagnosis found.    MDM  Chest xray normal,   Pt given motrin and hydrocodone, po fluids       Lonia Skinner [place], [place] 02/23/12 1529

## 2012-02-24 NOTE — ED Provider Notes (Signed)
  Medical screening examination/treatment/procedure(s) were performed by non-physician practitioner and as supervising physician I was immediately available for consultation/collaboration.    Vida Roller, MD 02/24/12 [phone]

## 2012-03-19 ENCOUNTER — Telehealth: Payer: Self-pay | Admitting: Internal Medicine

## 2012-03-19 MED ORDER — METOPROLOL SUCCINATE ER 50 MG PO TB24: 50.0000 mg | ORAL_TABLET | Freq: Every day | ORAL | Status: DC

## 2012-03-19 NOTE — Telephone Encounter (Signed)
Call patient, make an appointment within a month, overbook okay. Call 1 months supply, no further refills without office visit. Please be sure patient understands.

## 2012-03-19 NOTE — Telephone Encounter (Signed)
Discussed with pt, scheduled appt 4.4.14

## 2012-03-19 NOTE — Telephone Encounter (Signed)
Pt has not been seen within a year. Ok to refill? 

## 2012-03-19 NOTE — Telephone Encounter (Signed)
refill Metoprolol ER Succinate (Tablet) 50 MG Take 1 tablet (50 mg total) by mouth daily WITH/OR  immediately following a meal. #30 last fill 2.11.14

## 2012-04-06 ENCOUNTER — Encounter: Payer: Self-pay | Admitting: Internal Medicine

## 2012-04-06 ENCOUNTER — Ambulatory Visit (INDEPENDENT_AMBULATORY_CARE_PROVIDER_SITE_OTHER): Payer: BC Managed Care – PPO | Admitting: Internal Medicine

## 2012-04-06 VITALS — BP 146/88 | HR 54 | Temp 97.6°F | Ht 64.5 in | Wt 157.0 lb

## 2012-04-06 DIAGNOSIS — I639 Cerebral infarction, unspecified: Secondary | ICD-10-CM

## 2012-04-06 DIAGNOSIS — Z0000 Encounter for general adult medical examination without abnormal findings: Secondary | ICD-10-CM

## 2012-04-06 DIAGNOSIS — R413 Other amnesia: Secondary | ICD-10-CM

## 2012-04-06 DIAGNOSIS — I6350 Cerebral infarction due to unspecified occlusion or stenosis of unspecified cerebral artery: Secondary | ICD-10-CM

## 2012-04-06 DIAGNOSIS — F419 Anxiety disorder, unspecified: Secondary | ICD-10-CM

## 2012-04-06 DIAGNOSIS — I10 Essential (primary) hypertension: Secondary | ICD-10-CM

## 2012-04-06 MED ORDER — LOSARTAN POTASSIUM-HCTZ 100-12.5 MG PO TABS: 1.0000 | ORAL_TABLET | Freq: Every day | ORAL | Status: DC

## 2012-04-06 NOTE — Patient Instructions (Addendum)
Take calcium (approximately 1 gram )and vitamin D ( 600 to 1200 units ) daily  Exercise 3 hours weekly Eat healthy ----- Check the  blood pressure 2 or 3 times a week, be sure it is between 110/60 and 140/85. If it is consistently higher or lower, let me know ---- Come back fasting next week for labs: CMP, FLP, A1c, CBC---d x v70 --- Next visit in 2 months

## 2012-04-06 NOTE — Assessment & Plan Note (Signed)
Related to a stroke. Resolved

## 2012-04-06 NOTE — Assessment & Plan Note (Signed)
Self discontinue Lexapro since I last saw her in 2012, not an issue at this point

## 2012-04-06 NOTE — Assessment & Plan Note (Addendum)
Tdap 2013 Zostavax discussed Female care per gynecology Never had a colonoscopy to my knowledge, IFOB provided  Diet and exercise discussed Daily calcium and vitamin D supplementation discussed as well Labs

## 2012-04-06 NOTE — Assessment & Plan Note (Signed)
Good compliance with metoprolol but not taking Hyzaar regularly. I strongly recommend her to take her medications regularly given her history of previous stroke.

## 2012-04-06 NOTE — Progress Notes (Signed)
Subjective:    Patient ID: [name], female    DOB: 1955-07-25, 57 y.o.   MRN: [id number]  HPI Last office visit 08-2010. Request a physical exam, today we also addressed a number of chronic medical issues. See assessment and plan  Past Medical History  Diagnosis Date  . Anemia   . Hypertension   . Hypokalemia   . Headache   . Anxiety and depression     admited w/ xanax overdose 6-12, Rx lexapro per psych  . CVA (cerebral infarction)     per MRI 07-2010  . NSVD (normal spontaneous vaginal delivery)     X3  . Missed ab    Past Surgical History  Procedure Laterality Date  . Appendectomy    . Cesarean section    . Tubal ligation    . Bladder surgery    . Bladder suspension      DONE IN [facility]  . Endometrial ablation      HER OPTION   History   Social History  . Marital Status: Married    Spouse Name: N/A    Number of Children: 3  . 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 Smoking status: Never Smoker   . Smokeless tobacco: Never Used  . Alcohol Use: No  . Drug Use: No  . Sexually Active: Not on file   Other Topics Concern  . Not on file   Social History Narrative   From [place]   5 g-kids   Diet: trying to eat healthy, low salt, no fast food    Exercise: no            Family History  Problem Relation Age of Onset  . Heart attack Father 81  . Diabetes      aunt  . Hypertension      many family members  . Colon cancer Neg Hx   . Breast cancer      distant relative     Review of Systems History of a stroke, I haven't seen her in a while, she seems to be doing well. At some point had problems with memory, currently holding her job w/o apparent  problems. Takes aspirin most days.  Occasional headaches, well-controlled with Advil as needed. Not taking Lexapro, denies feeling depressed at this time, she does have some stress and difficult days but no depression  per se. High blood pressure, good compliance with metoprolol but has not been taking Hyzaar regularly, no recent ambulatory BPs. No chest pain or shortness or breath Normal, vomiting, diarrhea. No difficulty sleeping. Occasional lower extremity edema       Objective:   Physical Exam BP 146/88  Pulse 54  Temp(Src) 97.6 F (36.4 C) (Oral)  Ht 5' 4.5" (1.638 m)  Wt 157 lb (71.215 kg)  BMI 26.54 kg/m2  SpO2 97%  General -- alert, well-developed, no apparent distress .   Neck --no thyromegaly , normal carotid pulse Lungs -- normal respiratory effort, no intercostal retractions, no accessory muscle use, and normal breath sounds.   Heart-- normal rate, regular rhythm, no murmur, and no gallop.   Abdomen--soft, non-tender, no distention, no masses, no HSM, no guarding, and no rigidity.   Extremities-- no pretibial edema bilaterally Neurologic-- alert & oriented to space time and self. Speech normal, gait and and strength normal as well.  Psych-- Cognition and judgment appear intact. Alert and cooperative with  normal attention span and concentration.  not anxious appearing and not depressed appearing.       Assessment & Plan:

## 2012-04-06 NOTE — Assessment & Plan Note (Signed)
Had extensive eval by neurology in 2012, no results available to me. She seems to be doing well, plan is to control cardiovascular risk factors and continue aspirin 325 which she has not been taking regularly. Encouraged to take it daily.

## 2012-04-07 ENCOUNTER — Encounter: Payer: Self-pay | Admitting: Internal Medicine

## 2012-04-07 IMAGING — CR DG SCAPULA*L*
4 series · 4 of 4 positions shown · non-contrast
Comparison: None.

CLINICAL DATA: Left scapular pain - no known injury

LEFT SCAPULA - 2+ VIEWS

[w scapula ap/pa left (1 of 2)]
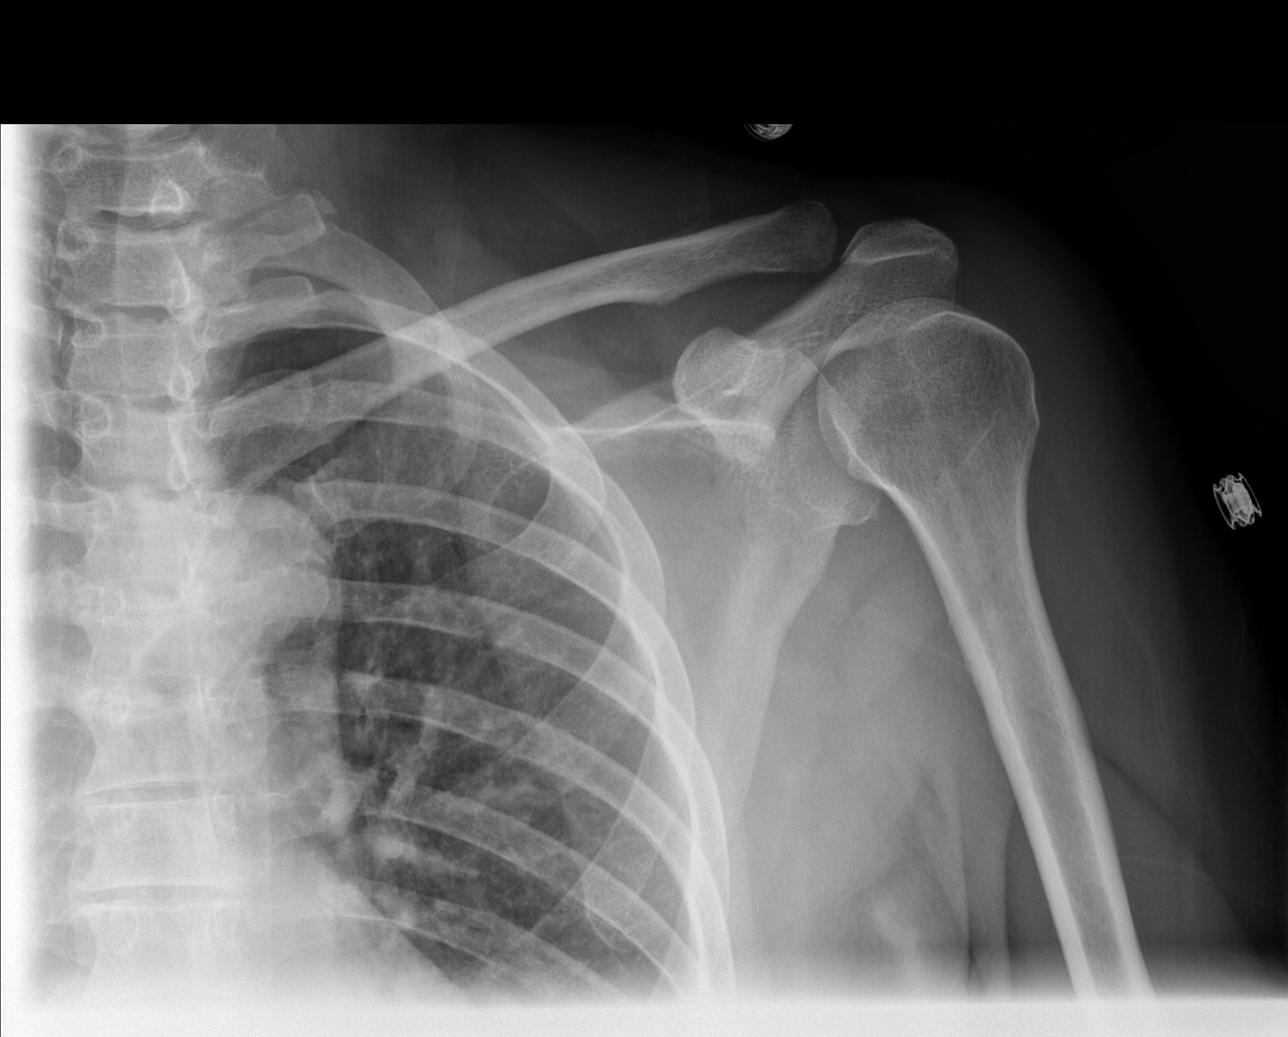

[w scapula ap/pa left (2 of 2)]
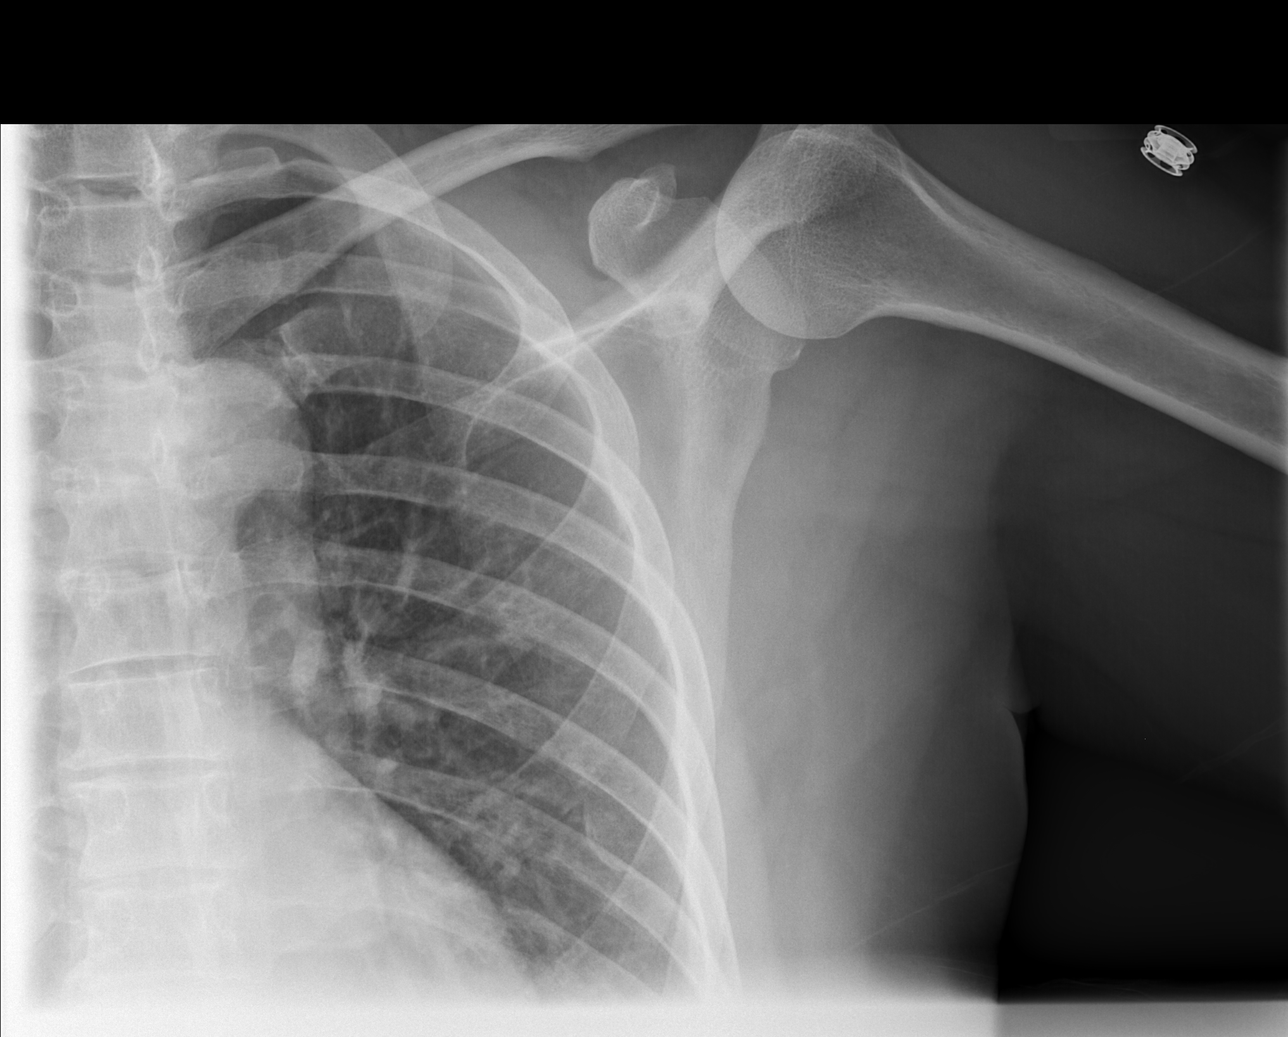

[w scapula lat left (1 of 2)]
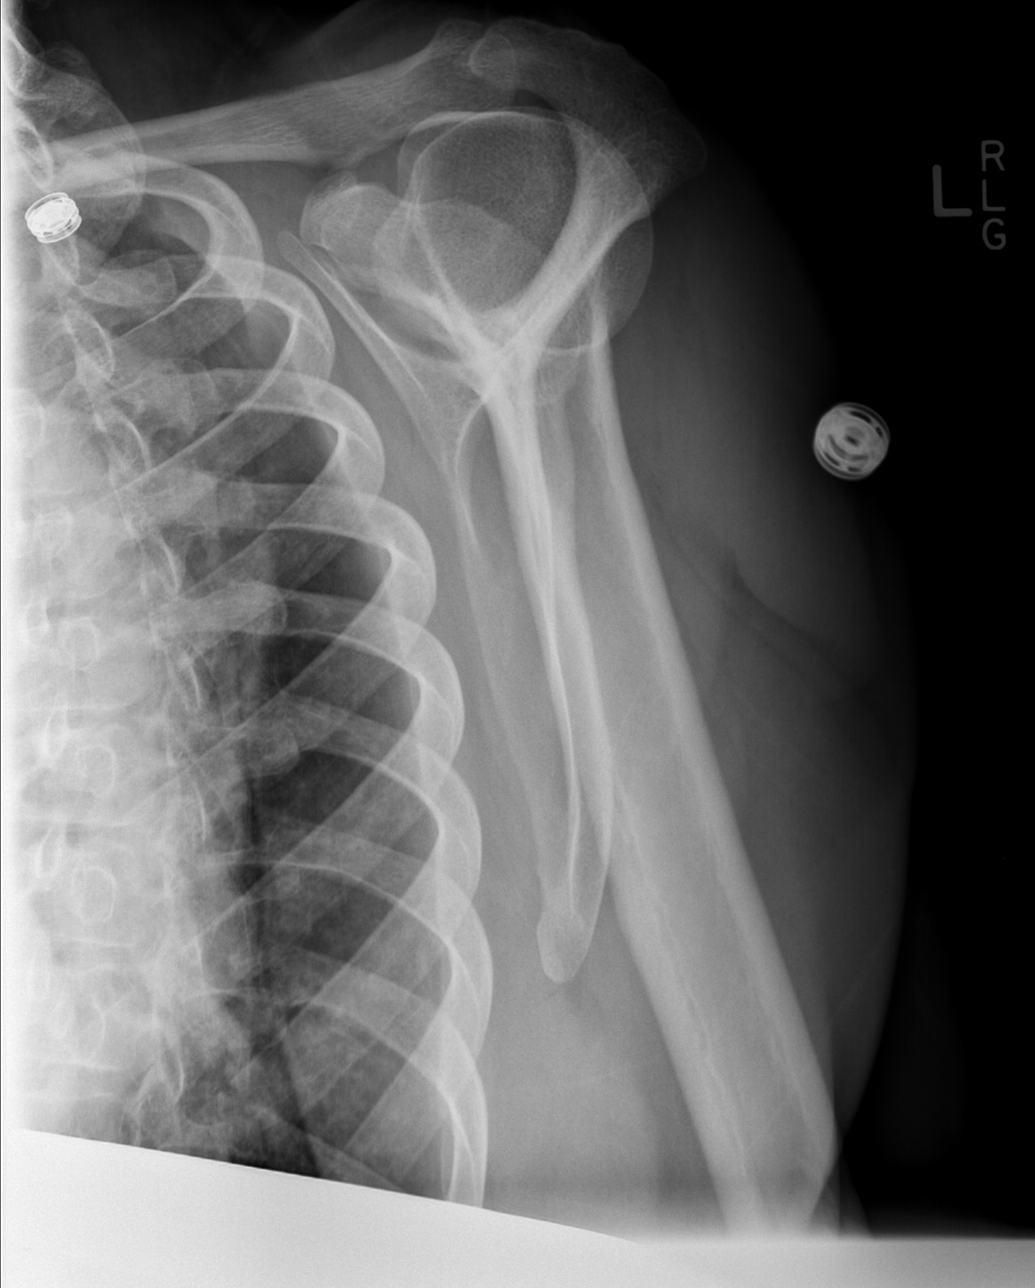

[w scapula lat left (2 of 2)]
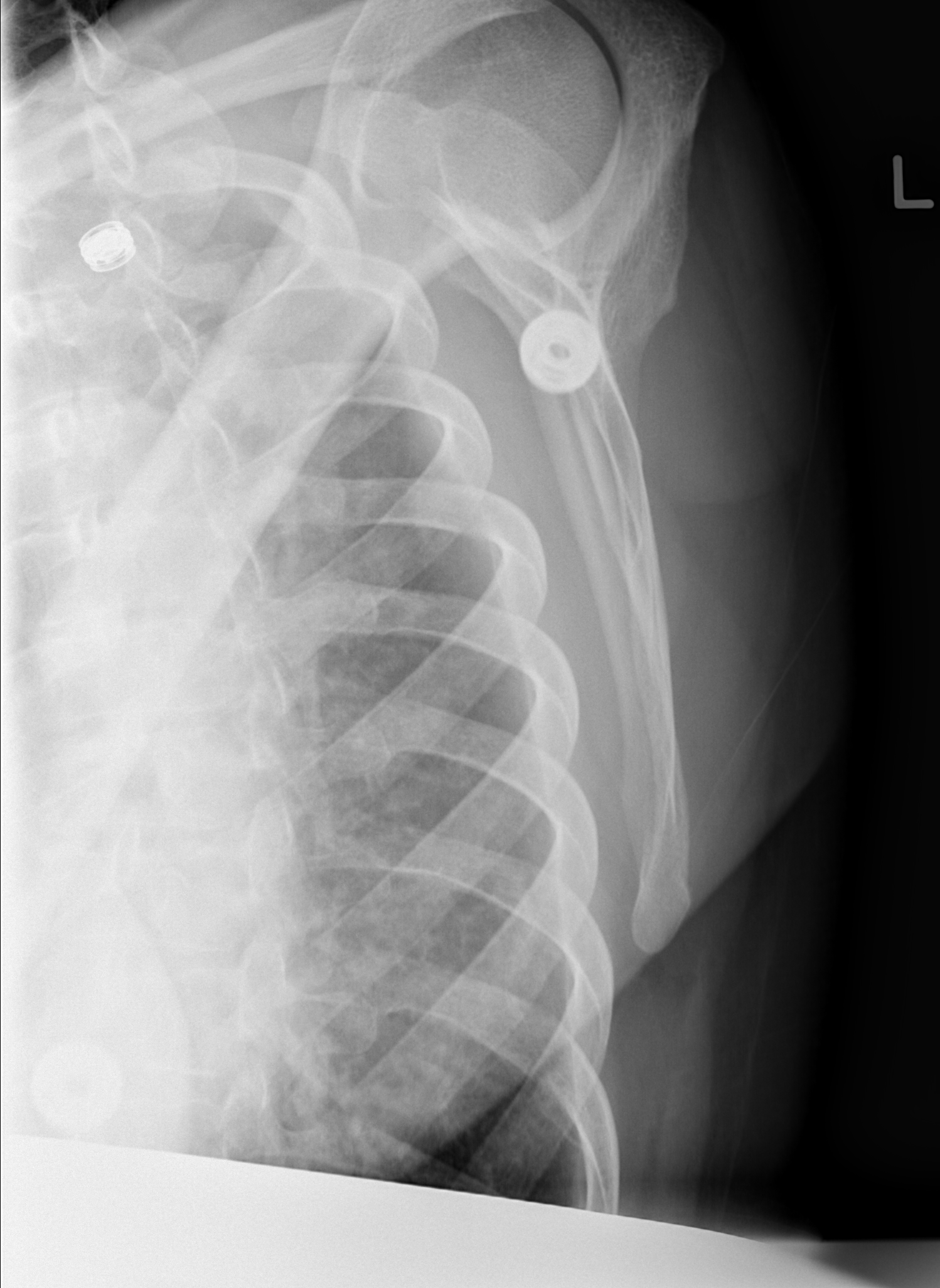

[4 of 4 positions shown; findings below may reference images not displayed]

FINDINGS: No fracture or dislocation.  No specific arthropathy. No
foreign body or other abnormality of the soft tissues.
IMPRESSION: No acute or significant findings.

## 2012-04-11 ENCOUNTER — Other Ambulatory Visit: Payer: BC Managed Care – PPO

## 2012-04-13 ENCOUNTER — Other Ambulatory Visit (INDEPENDENT_AMBULATORY_CARE_PROVIDER_SITE_OTHER): Payer: BC Managed Care – PPO

## 2012-04-13 DIAGNOSIS — Z0000 Encounter for general adult medical examination without abnormal findings: Secondary | ICD-10-CM

## 2012-04-13 LAB — LIPID PANEL
HDL: 43.5 mg/dL (ref >39.00)
Total CHOL/HDL Ratio: 4
VLDL: 17.8 mg/dL (ref 0.0–40.0)

## 2012-04-13 LAB — COMPREHENSIVE METABOLIC PANEL
ALT: 16 U/L (ref 0–35)
AST: 17 U/L (ref 0–37)
Albumin: 4 g/dL (ref 3.5–5.2)
Alkaline Phosphatase: 47 U/L (ref 39–117)
Creatinine, Ser: 0.8 mg/dL (ref 0.4–1.2)
GFR: 81.57 mL/min (ref >60.00)
Total Bilirubin: 1.3 mg/dL — ABNORMAL HIGH (ref 0.3–1.2)
Total Protein: 7.1 g/dL (ref 6.0–8.3)

## 2012-04-13 LAB — HEMOGLOBIN A1C: Hgb A1c MFr Bld: 5.6 % (ref 4.6–6.5)

## 2012-04-13 MED ORDER — POTASSIUM CHLORIDE ER 10 MEQ PO TBCR: 10.0000 meq | EXTENDED_RELEASE_TABLET | Freq: Every day | ORAL | Status: DC

## 2012-04-19 ENCOUNTER — Other Ambulatory Visit: Payer: Self-pay | Admitting: Internal Medicine

## 2012-04-20 NOTE — Telephone Encounter (Signed)
Refill done.  

## 2012-05-27 ENCOUNTER — Telehealth: Payer: Self-pay | Admitting: Internal Medicine

## 2012-05-27 DIAGNOSIS — I10 Essential (primary) hypertension: Secondary | ICD-10-CM

## 2012-05-27 NOTE — Telephone Encounter (Signed)
Did not RTC for labs only. Please arrange labs: K -- dx HTN

## 2012-05-29 ENCOUNTER — Encounter: Payer: Self-pay | Admitting: *Deleted

## 2012-05-29 NOTE — Telephone Encounter (Signed)
Lab orders entered. Letter mailed to pt to make aware she is due for labs.

## 2012-06-06 ENCOUNTER — Ambulatory Visit: Payer: BC Managed Care – PPO | Admitting: Internal Medicine

## 2012-06-06 DIAGNOSIS — Z0289 Encounter for other administrative examinations: Secondary | ICD-10-CM

## 2012-06-25 ENCOUNTER — Encounter: Payer: Self-pay | Admitting: *Deleted

## 2012-12-03 ENCOUNTER — Encounter: Payer: Self-pay | Admitting: Internal Medicine

## 2012-12-03 ENCOUNTER — Ambulatory Visit (INDEPENDENT_AMBULATORY_CARE_PROVIDER_SITE_OTHER): Payer: BC Managed Care – PPO | Admitting: Internal Medicine

## 2012-12-03 VITALS — BP 200/105 | HR 56 | Temp 98.2°F | Wt 158.0 lb

## 2012-12-03 DIAGNOSIS — I10 Essential (primary) hypertension: Secondary | ICD-10-CM

## 2012-12-03 MED ORDER — AMLODIPINE BESYLATE 10 MG PO TABS: 10.0000 mg | ORAL_TABLET | Freq: Every day | ORAL | Status: DC

## 2012-12-03 MED ORDER — LOSARTAN POTASSIUM-HCTZ 100-25 MG PO TABS: 1.0000 | ORAL_TABLET | Freq: Every day | ORAL | Status: DC

## 2012-12-03 MED ORDER — METOPROLOL SUCCINATE ER 50 MG PO TB24: ORAL_TABLET | ORAL | Status: DC

## 2012-12-03 NOTE — Patient Instructions (Signed)
Patient will come back tomorrow 12-04-12 (walk in) for labs Needs OV to see me Friday, please schedule   [place] todos los dias regrese man~ana solamente para sus laboratorios . regrese para verme el viernes , por favor haga una cita

## 2012-12-03 NOTE — Assessment & Plan Note (Addendum)
Uncontrolled BP for at least 2 months, BP repeated manually by me and I confirm it was 200/100 bilaterally  Other than feeling sometimes  fatigued she does not have symptoms. Risks of uncontrolled high blood pressure discussed with the patient. Plan: add amlodipine Continue metoprolol, pulse is in the 50s Continue to losartan, increase HCTZ from 12.5 to 25. CBC, BMP tomorrow. Followup in 4 days.

## 2012-12-03 NOTE — Progress Notes (Signed)
   Subjective:    Patient ID: Nekisha Mcdiarmid, female    DOB: 12-06-1955, 57 y.o.   MRN: [id number]  HPI Here to discuss hypertension Last office visit April 2014, at the time her BP was satisfactory and BMP was normal except for potassium which was low . 2 months ago, her BP was checked by a nurse at work, she was immediately referred to the ER because BP was very high and was recommended to follow with me ASAP.  Reports good compliance with medications, states she run out of meds today. No recent ambulatory BPs   Past Medical History  Diagnosis Date  . Anemia   . Hypertension   . Hypokalemia   . Headache(784.0)   . Anxiety and depression     admited w/ xanax overdose 6-12, Rx lexapro per psych  . CVA (cerebral infarction)     per MRI 07-2010  . NSVD (normal spontaneous vaginal delivery)     X3  . Missed ab    Past Surgical History  Procedure Laterality Date  . Appendectomy    . Cesarean section    . Tubal ligation    . Bladder surgery    . Bladder suspension      DONE IN [facility]  . Endometrial ablation      HER OPTION     Review of Systems Denies chest pain or sob at present; sometimes feels fatigued  No nausea, vomiting, diarrhea. No headaches or visual disturbances. Denies diplopia, facial numbness    Objective:   Physical Exam  BP 200/105  Pulse 56  Temp(Src) 98.2 F (36.8 C)  Wt 158 lb (71.668 kg)  SpO2 98% General -- alert, well-developed, NAD.   Lungs -- normal respiratory effort, no intercostal retractions, no accessory muscle use, and normal breath sounds.  Heart-- normal rate, regular rhythm, no murmur.  Extremities-- no pretibial edema bilaterally  Neurologic--  alert & oriented X3. Speech normal, gait normal, strength normal in all extremities.  EOMI   Psych-- Cognition and judgment appear intact. Cooperative with normal attention span and concentration. No anxious appearing , no depressed appearing.     Assessment & Plan:

## 2012-12-03 NOTE — Progress Notes (Signed)
Pre visit review using our clinic review tool, if applicable. No additional management support is needed unless otherwise documented below in the visit note. 

## 2012-12-05 ENCOUNTER — Other Ambulatory Visit (INDEPENDENT_AMBULATORY_CARE_PROVIDER_SITE_OTHER): Payer: BC Managed Care – PPO

## 2012-12-05 DIAGNOSIS — I10 Essential (primary) hypertension: Secondary | ICD-10-CM

## 2012-12-07 ENCOUNTER — Encounter: Payer: Self-pay | Admitting: Internal Medicine

## 2012-12-07 ENCOUNTER — Ambulatory Visit (INDEPENDENT_AMBULATORY_CARE_PROVIDER_SITE_OTHER): Payer: BC Managed Care – PPO | Admitting: Internal Medicine

## 2012-12-07 VITALS — BP 131/74 | HR 62 | Temp 98.1°F | Wt 157.0 lb

## 2012-12-07 DIAGNOSIS — I10 Essential (primary) hypertension: Secondary | ICD-10-CM

## 2012-12-07 LAB — CBC WITH DIFFERENTIAL/PLATELET
Basophils Relative: 0 % (ref 0–1)
Eosinophils Absolute: 0.4 10*3/uL (ref 0.0–0.7)
Eosinophils Relative: 3 % (ref 0–5)
Hemoglobin: 13.3 g/dL (ref 12.0–15.0)
MCH: 29.4 pg (ref 26.0–34.0)
MCHC: 35.4 g/dL (ref 30.0–36.0)
Monocytes Relative: 7 % (ref 3–12)
Neutrophils Relative %: 65 % (ref 43–77)

## 2012-12-07 LAB — BASIC METABOLIC PANEL
BUN: 18 mg/dL (ref 6–23)
Calcium: 9.2 mg/dL (ref 8.4–10.5)
Creat: 0.85 mg/dL (ref 0.50–1.10)
Glucose, Bld: 92 mg/dL (ref 70–99)
Sodium: 138 mEq/L (ref 135–145)

## 2012-12-07 MED ORDER — LOSARTAN POTASSIUM-HCTZ 100-25 MG PO TABS: 1.0000 | ORAL_TABLET | Freq: Every day | ORAL | Status: DC

## 2012-12-07 MED ORDER — AMLODIPINE BESYLATE 10 MG PO TABS: 10.0000 mg | ORAL_TABLET | Freq: Every day | ORAL | Status: DC

## 2012-12-07 MED ORDER — METOPROLOL SUCCINATE ER 50 MG PO TB24: ORAL_TABLET | ORAL | Status: DC

## 2012-12-07 NOTE — Progress Notes (Signed)
Pre visit review using our clinic review tool, if applicable. No additional management support is needed unless otherwise documented below in the visit note. 

## 2012-12-07 NOTE — Patient Instructions (Addendum)
Get your blood work before you leave  Next visit for a  follow up  regards  hypertension , fasting, in 4 months  Please make an appointment    Check the  blood pressure  weekly be sure it is between 110/60 and 140/85. Ideal blood pressure is 120/80. If it is consistently higher or lower, let me know  Tome sus [employer] dias, si necesita un refill, llame a la farmacia   tomese la presion en la farmacia todas las [place] , debe estar  entre 110/60 y 140/85. Ideal es  120/80.Si su presion no esta bien , no espere 4 meses, llamenos antes

## 2012-12-07 NOTE — Assessment & Plan Note (Addendum)
See previous entry, BP was quite elevated, medication was adjusted, currently on Losartan HCT 100-25, metoprolol 50 mg daily, amlodipine 10 mg. BP today is very good. Labs were requested but only a potassium was done. Plan: Continue same medications BMP CBC Strongly encouraged to continue with his BP medications for life, risks of silent HTN discussed Currently not taking ambulatory BPs, rec amb BPs at the pharmacy. Next visit 3-4 months

## 2012-12-07 NOTE — Progress Notes (Signed)
   Subjective:    Patient ID: [name], female    DOB: [birth year], 57 y.o.   MRN: [id number]  HPI Followup from previous visit. BP was quite elevated, medications were adjusted, good compliance, no apparent side effects. No ambulatory BPs. Also the patient developed URI, symptoms started 3 days ago, complain of fatigue, cough and mild shortness of breath w/ cough  Past Medical History  Diagnosis Date  . Anemia   . Hypertension   . Hypokalemia   . Headache(784.0)   . Anxiety and depression     admited w/ xanax overdose 6-12, Rx lexapro per psych  . CVA (cerebral infarction)     per MRI 07-2010  . NSVD (normal spontaneous vaginal delivery)     X3  . Missed ab    Past Surgical History  Procedure Laterality Date  . Appendectomy    . Cesarean section    . Tubal ligation    . Bladder surgery    . Bladder suspension      DONE IN [facility]  . Endometrial ablation      HER OPTION   Review of Systems Denies nausea, vomiting, diarrhea. No headaches No chest pain. Mild yellow sputum production. Denies any orthostatic symptoms or dizziness.     Objective:   Physical Exam BP 131/74  Pulse 62  Temp(Src) 98.1 F (36.7 C)  Wt 157 lb (71.215 kg)  SpO2 100% General -- alert, well-developed, NAD.  HEENT-- Not pale. TMs normal, throat symmetric, no redness or discharge. Face symmetric, sinuses not tender to palpation. Nose slt congested. Lungs -- normal respiratory effort, no intercostal retractions, no accessory muscle use, and normal breath sounds.  Heart-- normal rate, regular rhythm, no murmur.  Extremities-- no pretibial edema bilaterally  Neurologic--  alert & oriented X3. Speech normal, gait normal, strength normal in all extremities.  Psych-- Cognition and judgment appear intact. Cooperative with normal attention span and concentration. No anxious appearing , no depressed appearing.      Assessment & Plan:  Has a URI, no evidence of a major infection. Advised pt  mucinex and tylenol, call if no better

## 2012-12-09 ENCOUNTER — Encounter: Payer: Self-pay | Admitting: Internal Medicine

## 2012-12-13 ENCOUNTER — Encounter: Payer: Self-pay | Admitting: Family Medicine

## 2012-12-13 ENCOUNTER — Ambulatory Visit (INDEPENDENT_AMBULATORY_CARE_PROVIDER_SITE_OTHER): Payer: BC Managed Care – PPO | Admitting: Family Medicine

## 2012-12-13 VITALS — BP 146/86 | HR 67 | Temp 97.7°F | Wt 158.2 lb

## 2012-12-13 DIAGNOSIS — J019 Acute sinusitis, unspecified: Secondary | ICD-10-CM

## 2012-12-13 MED ORDER — PROMETHAZINE-DM 6.25-15 MG/5ML PO SYRP: 5.0000 mL | ORAL_SOLUTION | Freq: Four times a day (QID) | ORAL | Status: DC | PRN

## 2012-12-13 MED ORDER — AMOXICILLIN 875 MG PO TABS: 875.0000 mg | ORAL_TABLET | Freq: Two times a day (BID) | ORAL | Status: DC

## 2012-12-13 NOTE — Progress Notes (Signed)
   Subjective:    Patient ID: [name], female    DOB: 05-Nov-1955, 57 y.o.   MRN: [id number]  HPI URI- sxs started 4 days ago, 'lots of coughing'.  Subjective fevers- alternating chills and sweats.  + facial pain/pressure.  Nausea w/ cough.  No ear pain but fullness.  + sick contacts.  No tooth pain.  Difficult hx to obtain due to language barrier   Review of Systems For ROS see HPI     Objective:   Physical Exam  Vitals reviewed. Constitutional: She appears well-developed and well-nourished. No distress.  HENT:  Head: Normocephalic and atraumatic.  Right Ear: Tympanic membrane normal.  Left Ear: Tympanic membrane normal.  Nose: Mucosal edema and rhinorrhea present. Right sinus exhibits maxillary sinus tenderness and frontal sinus tenderness. Left sinus exhibits maxillary sinus tenderness and frontal sinus tenderness.  Mouth/Throat: Uvula is midline and mucous membranes are normal. Posterior oropharyngeal erythema present. No oropharyngeal exudate.  Eyes: Conjunctivae and EOM are normal. Pupils are equal, round, and reactive to light.  Neck: Normal range of motion. Neck supple.  Cardiovascular: Normal rate, regular rhythm and normal heart sounds.   Pulmonary/Chest: Effort normal and breath sounds normal. No respiratory distress. She has no wheezes.  Wet cough  Lymphadenopathy:    She has no cervical adenopathy.          Assessment & Plan:

## 2012-12-13 NOTE — Assessment & Plan Note (Signed)
Pt's sxs and PE consistent w/ infxn.  Start abx.  Cough meds prn.  Reviewed supportive care and red flags that should prompt return.  Pt expressed understanding and is in agreement w/ plan.  

## 2012-12-13 NOTE — Patient Instructions (Signed)
Follow up as needed Start the Amoxicillin twice daily- take w/ food Use the cough syrup as needed- will cause drowsiness Drink plenty of fluids REST! Call with any questions or concerns Hang in there! Happy Holidays!

## 2012-12-13 NOTE — Progress Notes (Signed)
Pre visit review using our clinic review tool, if applicable. No additional management support is needed unless otherwise documented below in the visit note. 

## 2012-12-17 ENCOUNTER — Telehealth: Payer: Self-pay | Admitting: *Deleted

## 2012-12-17 NOTE — Telephone Encounter (Signed)
Patient given letter for work excuse.

## 2012-12-18 ENCOUNTER — Other Ambulatory Visit: Payer: Self-pay | Admitting: *Deleted

## 2012-12-18 DIAGNOSIS — I10 Essential (primary) hypertension: Secondary | ICD-10-CM

## 2012-12-18 MED ORDER — POTASSIUM CHLORIDE ER 10 MEQ PO TBCR: 10.0000 meq | EXTENDED_RELEASE_TABLET | Freq: Every day | ORAL | Status: DC

## 2013-01-08 ENCOUNTER — Other Ambulatory Visit: Payer: BC Managed Care – PPO

## 2013-02-25 ENCOUNTER — Telehealth: Payer: Self-pay | Admitting: *Deleted

## 2013-02-25 NOTE — Telephone Encounter (Signed)
Patient called and stated that she tried calling us and her pharmacy sent medications requests. Patient needs a refill for amLODipine (NORVASC) 10 MG tablet and metoprolol succinate (TOPROL-XL) 50 MG 24 hr tablet.    Pharmacy [facility] [id number] - [place], [place] - [address] [facility] [address]

## 2013-02-26 ENCOUNTER — Other Ambulatory Visit: Payer: Self-pay | Admitting: Internal Medicine

## 2013-02-26 MED ORDER — METOPROLOL SUCCINATE ER 50 MG PO TB24: ORAL_TABLET | ORAL | Status: DC

## 2013-02-26 MED ORDER — AMLODIPINE BESYLATE 10 MG PO TABS: 10.0000 mg | ORAL_TABLET | Freq: Every day | ORAL | Status: DC

## 2013-02-26 NOTE — Telephone Encounter (Signed)
Rx sent      KP 

## 2013-04-12 ENCOUNTER — Ambulatory Visit: Payer: BC Managed Care – PPO | Admitting: Internal Medicine

## 2013-04-12 DIAGNOSIS — Z0289 Encounter for other administrative examinations: Secondary | ICD-10-CM

## 2013-05-31 ENCOUNTER — Ambulatory Visit (INDEPENDENT_AMBULATORY_CARE_PROVIDER_SITE_OTHER): Payer: BC Managed Care – PPO | Admitting: Internal Medicine

## 2013-05-31 ENCOUNTER — Encounter: Payer: Self-pay | Admitting: Internal Medicine

## 2013-05-31 VITALS — BP 122/73 | HR 59 | Temp 98.2°F | Wt 158.0 lb

## 2013-05-31 DIAGNOSIS — I10 Essential (primary) hypertension: Secondary | ICD-10-CM

## 2013-05-31 DIAGNOSIS — D72829 Elevated white blood cell count, unspecified: Secondary | ICD-10-CM

## 2013-05-31 DIAGNOSIS — M549 Dorsalgia, unspecified: Secondary | ICD-10-CM

## 2013-05-31 LAB — BASIC METABOLIC PANEL
BUN: 17 mg/dL (ref 6–23)
CHLORIDE: 98 meq/L (ref 96–112)
CO2: 31 meq/L (ref 19–32)
CREATININE: 0.79 mg/dL (ref 0.50–1.10)
Calcium: 9.4 mg/dL (ref 8.4–10.5)
GLUCOSE: 82 mg/dL (ref 70–99)
POTASSIUM: 3.4 meq/L — AB (ref 3.5–5.3)
Sodium: 139 mEq/L (ref 135–145)

## 2013-05-31 MED ORDER — CYCLOBENZAPRINE HCL 10 MG PO TABS: 10.0000 mg | ORAL_TABLET | Freq: Every evening | ORAL | Status: DC | PRN

## 2013-05-31 NOTE — Progress Notes (Signed)
Subjective:    Patient ID: [name], female    DOB: Apr 19, 1955, 58 y.o.   MRN: [id number]  DOS:  05/31/2013 Type of  Visit: ROV Hypertension, good medication compliance, no recent ambulatory BPs Leukocytosis, due for a repeat CBC 3 weeks ago developed pain at the mid right back, is on and off, slightly worse with certain positions. Symptoms started while she was working in [place] for 2 months and doing the job that required her to be sitting but moving her upper extremities all the time. Symptoms started before she took the airplane back to [place].    ROS Denies fever or chills No SSCP. No difficulty breathing, palpitations. She occasionally has bilateral lower extremity edema at night, no calf pain. No cough or sputum production  Past Medical History  Diagnosis Date  . Anemia   . Hypertension   . Hypokalemia   . Headache(784.0)   . Anxiety and depression     admited w/ xanax overdose 6-12, Rx lexapro per psych  . CVA (cerebral infarction)     per MRI 07-2010  . NSVD (normal spontaneous vaginal delivery)     X3  . Missed ab     Past Surgical History  Procedure Laterality Date  . Appendectomy    . Cesarean section    . Tubal ligation    . Bladder surgery    . Bladder suspension      DONE IN [facility]  . Endometrial ablation      HER OPTION    History   Social History  . Marital Status: Married    Spouse Name: N/A    Number of Children: 3  . 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 Smoking status: Never Smoker   . Smokeless tobacco: Never Used  . Alcohol Use: No  . Drug Use: No  . Sexual Activity: Not on file   Other Topics Concern  . Not on file   Social History Narrative   From [place]   5 g-kids   Diet: trying to eat healthy, low salt, no fast food    Exercise: no                 Medication List       This list is accurate as of: 05/31/13 11:59 PM.   Always use your most recent med list.               amLODipine 10 MG tablet  Commonly known as:  NORVASC  TAKE 1 TABLET BY MOUTH DAILY     aspirin 325 MG tablet  Take 325 mg by mouth daily.     cyclobenzaprine 10 MG tablet  Commonly known as:  FLEXERIL  Take 1 tablet (10 mg total) by mouth at bedtime as needed for muscle spasms.     losartan-hydrochlorothiazide 100-25 MG per tablet  Commonly known as:  HYZAAR  Take 1 tablet by mouth daily.     metoprolol succinate 50 MG 24 hr tablet  Commonly known as:  TOPROL-XL  TAKE 1 TABLET BY MOUTH DAILY WITH OR IMMEDIATELY FOLLOWING A MEAL.           Objective:   Physical Exam  Pulmonary/Chest:     BP 122/73  Pulse 59  Temp(Src) 98.2 F (36.8 C) (Oral)  Wt 158 lb (71.668 kg)  SpO2 100%  General -- alert, well-developed, NAD.  Neck --no TTP Lungs -- normal respiratory effort, no intercostal retractions, no accessory muscle use, and normal breath sounds.  Heart-- normal rate, regular rhythm, no murmur.  Extremities-- no pretibial edema bilaterally ; calves symmetric  Neurologic--  alert & oriented X3. Speech normal, gait normal, strength normal in all extremities.  Psych-- Cognition and judgment appear intact. Cooperative with normal attention span and concentration. No anxious or depressed appearing.         Assessment & Plan:  back pain Back pain as described above, likely musculoskeletal issue. Symptoms started before [place] a trip [place] to [place] after stay x [place] in [place]. My suspicious for something serious like a PE is very low. Plan: Flexeril, heating pad, Tylenol. If not better she will let me know

## 2013-05-31 NOTE — Assessment & Plan Note (Signed)
WBCs increase x2, rechecked today

## 2013-05-31 NOTE — Patient Instructions (Signed)
Get your blood work before you leave   Next visit is for a physical exam in 3-4 months ,  fasting Please make an appointment    para el dolor: Tome flexeril en la noche  Tylenol cuando lo necesite apliquese un "heating pad" en la espalda en la noche  llame si no mejora en 10 dias

## 2013-05-31 NOTE — Progress Notes (Signed)
Pre-visit discussion using our clinic review tool. No additional management support is needed unless otherwise documented below in the visit note.  

## 2013-05-31 NOTE — Assessment & Plan Note (Signed)
Good compliance of medication, BP seems to be well-controlled, currently not taking potassium. Plan: BMP, if potassium is low again she will need potassium supplements daily

## 2013-06-01 ENCOUNTER — Telehealth: Payer: Self-pay | Admitting: Internal Medicine

## 2013-06-01 NOTE — Telephone Encounter (Signed)
Relevant patient education mailed to patient.  

## 2013-06-06 ENCOUNTER — Telehealth: Payer: Self-pay | Admitting: *Deleted

## 2013-06-06 DIAGNOSIS — D72829 Elevated white blood cell count, unspecified: Secondary | ICD-10-CM

## 2013-06-06 NOTE — Telephone Encounter (Signed)
Spoke with patient who is currently in [place], advised that current labs were okay but we needed to do additional labs. Patient scheduled to come in Wed 06/12/13 for CBC. Future orders placed

## 2013-06-06 NOTE — Telephone Encounter (Signed)
Message copied by Baldwin [place] on Thu Jun 06, 2013  3:17 PM ------      Message from: [name]      Created: Thu Jun 06, 2013  3:04 PM       Advise patient,       BMP okay      She needed a CBC, please get the results otherwise advise patient to come back for a redraw ------

## 2013-06-12 ENCOUNTER — Other Ambulatory Visit: Payer: BC Managed Care – PPO

## 2013-06-25 ENCOUNTER — Ambulatory Visit (INDEPENDENT_AMBULATORY_CARE_PROVIDER_SITE_OTHER): Payer: BC Managed Care – PPO | Admitting: Gynecology

## 2013-06-25 ENCOUNTER — Encounter: Payer: Self-pay | Admitting: Gynecology

## 2013-06-25 ENCOUNTER — Other Ambulatory Visit (HOSPITAL_COMMUNITY)
Admission: RE | Admit: 2013-06-25 | Discharge: 2013-06-25 | Disposition: A | Discharge status: Home / Self Care | Payer: BC Managed Care – PPO | Source: Ambulatory Visit | Attending: Gynecology | Admitting: Gynecology

## 2013-06-25 VITALS — BP 130/80 | Ht 62.5 in | Wt 156.0 lb

## 2013-06-25 DIAGNOSIS — F32A Depression, unspecified: Secondary | ICD-10-CM | POA: Insufficient documentation

## 2013-06-25 DIAGNOSIS — Z1151 Encounter for screening for human papillomavirus (HPV): Secondary | ICD-10-CM | POA: Insufficient documentation

## 2013-06-25 DIAGNOSIS — Z01419 Encounter for gynecological examination (general) (routine) without abnormal findings: Secondary | ICD-10-CM

## 2013-06-25 DIAGNOSIS — F329 Major depressive disorder, single episode, unspecified: Secondary | ICD-10-CM

## 2013-06-25 DIAGNOSIS — F3289 Other specified depressive episodes: Secondary | ICD-10-CM

## 2013-06-25 MED ORDER — SERTRALINE HCL 50 MG PO TABS: 50.0000 mg | ORAL_TABLET | Freq: Every day | ORAL | Status: DC

## 2013-06-25 NOTE — Patient Instructions (Addendum)
Sertraline oral solution Qu es este medicamento? La SERTRALINA se utiliza para tratar la depresin. Este medicamento tambin puede ayudar a Dealer con trastorno obsesivo-compulsivo, trastorno de pnico, trastorno de estrs postraumtico, trastorno disfrico premenstrual (TDPM) o ansiedad social. [place] medicamento puede ser utilizado para otros usos; si tiene alguna pregunta consulte con su proveedor de atencin mdica o con su farmacutico. MARCAS COMERCIALES DISPONIBLES: Zoloft Qu le debo informar a mi profesional de la salud antes de tomar este medicamento? Necesita saber si usted presenta alguno de los siguientes problemas o situaciones: -trastorno bipolar o antecedentes familiares del trastorno bipolar -diabetes -glaucoma -enfermedad cardiaca -alta presin sangunea -antecedentes de pulso cardiaco irregular -antecedentes de niveles bajos de calcio, magnesio o potasio en la sangre -si consume alcohol con frecuencia -enfermedad heptica -recibe tratamiento electroconvulsivo -convulsiones -ideas suicidas, planes o si usted o alguien de su familia ha intentado un suicidio previo -enfermedad tiroidea -una reaccin alrgica o inusual a la sertralina, a otros medicamentos, alimentos, colorantes o conservadores -si est embarazada o buscando quedar embarazada -si est amamantando a un beb Cmo debo utilizar este medicamento? Tome este medicamento por va oral. Siga las instrucciones de la etiqueta del [place]. Antes de tomar su dosis, es necesario que diluya la solucin an ms en una bebida. Mida la dosis del medicamento con el gotero dosificador que se encuentra en el frasco. Luego, coloque la dosis medida en al menos 120 ml o 4 onzas de [address], refresco de jengibre o de lima limn, [place] o jugo de [place] y [place]. No la mezcle con jugo de toronja ni con otros lquidos que no sean los que se Engineer, technical sales. Beba toda la taza de la mezcla inmediatamente. No prepare sus  dosis antes de que sea necesario tomarlas. Este medicamento se puede tomar con o sin alimentos. Tome sus dosis a intervalos regulares. No tome su medicamento con una frecuencia mayor a la indicada. No deje de tomar [employer] repentinamente a menos que as indique su mdico. El detener este medicamento demasiado rpido puede causar efectos secundarios graves o puede empeorar su condicin. Su farmacutico le dar una gua del medicamento especial con cada receta y relleno. Asegrese de leer esta informacin cada vez cuidadosamente. Hable con su pediatra para informarse acerca del uso de este medicamento en nios. Aunque este medicamento puede ser recetado a nios tan menores como de 7 aos de edad para condiciones selectivas, las precauciones se aplican. Sobredosis: Pngase en contacto inmediatamente con un centro toxicolgico o una sala de urgencia si usted cree que haya tomado demasiado medicamento. ATENCIN: [employer] es solo para usted. No comparta este medicamento con nadie. Qu sucede si me olvido de una dosis? Si olvida una dosis, tmela lo antes posible. Si es casi la hora de la prxima dosis, tome slo esa dosis. No tome dosis adicionales o dobles. Qu puede interactuar con este medicamento? No tome esta medicina con ninguno de los siguientes medicamentos: -ciertos medicamentos para infecciones micticas, tales como fluconazol, itraconazol, quetoconazol, posaconazol, voriconazol -cisapride -disulfiram -dofetilido -linezolid -IMAOs, tales como Carbex, Eldepryl, Marplan, Nardil y Parnate -metronidazol -azul de metileno (Paramedic) -pimozida -tioridazina -ziprasidona Esta medicina tambin puede interactuar con los siguientes medicamentos: -alcohol -aspirina o medicamentos tipo aspirina -ciertos medicamentos para la depresin, ansiedad o trastornos psicticos -ciertos medicamentos para el pulso cardiaco irregular, tales como flecainida, propafenona -ciertos  medicamentos para migraas, tales como almotriptn, eletriptn, frovatriptn, naratriptn, rizatriptn, sumatriptn, zolmitriptn -ciertos medicamentos para conciliar el sueo -ciertos medicamentos para convulsiones, tales como carbamazepina, cido valproico,  fenitona -ciertos medicamentos que tratan o previenen cogulos sanguneos, tales como warfarina, enoxaparina, dalteparina -cimetidina -digoxina -diurticos -fentanilo -furazolidona -isoniazida -litio -los AINEs, medicamentos para el dolor o inflamacin, tales como ibuprofeno o naproxeno -otros medicamentos que prolongan el intervalo QT (causa un ritmo cardiaco anormal) -procarbazina -rasagilina -suplementos como hierba de [place], kava kava, valeriana -tolbutamida -tramadol -triptfano Puede ser que esta lista no menciona todas las posibles interacciones. Informe a su profesional de Beazer Homesla salud de [employer] Inctodos los productos a base de hierbas, medicamentos de Bascomventa libre o suplementos nutritivos que est tomando. Si usted fuma, consume bebidas alcohlicas o si utiliza drogas ilegales, indqueselo tambin a su profesional de Beazer Homesla salud. Algunas sustancias pueden interactuar con su medicamento. A qu debo estar atento al usar PPL Corporationeste medicamento? Informe a su mdico si sus sntomas no mejoran o si empeoran. Visite a su mdico o a su profesional de la salud para chequear su evolucin peridicamente. Debido que puede ser necesario tomar este medicamento durante varias semanas para que sea posible observar sus efectos en forma Morancompleta, es importante que sigue su tratamiento como recetado por su mdico. Los pacientes y sus familias deben estar atentos si empeora la depresin o ideas suicidas. Tambin est atento a cambios repentinos o severos de emocin, tales como el sentirse ansioso, agitado, lleno de pnico, irritable, hostil, agresivo, impulsivo, inquietud severa, demasiado excitado y hiperactivo o dificultad para conciliar el sueo. Si esto ocurre,  especialmente al comenzar con tratamiento antidepresivo o al cambiar de dosis, comunquese con su profesional de Beazer Homesla salud. Puede experimentar somnolencia o mareos. No conduzca ni utilice maquinaria, ni haga nada que Scientist, research (life sciences)le exija permanecer en estado de alerta hasta que sepa cmo le afecta este medicamento. No se siente ni se ponga de pie con rapidez, especialmente si es un paciente de edad avanzada. Esto reduce el riesgo de mareos o Newell Rubbermaiddesmayos. El alcohol puede interferir con el efecto del medicamento. Evite consumir bebidas alcohlicas. Este medicamento contiene un alto porcentaje de alcohol que puede interactuar con medicamentos que se utilizan para tratar el alcoholismo, como el Antabuse (disulfiram). No debe tomar estos medicamentos juntos. Se le podr secar la boca. Masticar chicle sin azcar, chupar caramelos duros y tomar agua en abundancia le ayudar a mantener la boca hmeda. Si el problema no desaparece o es severo, consulte a su mdico. Qu efectos secundarios puedo tener al Boston Scientificutilizar este medicamento? Efectos secundarios que debe informar a su mdico o a Producer, television/film/videosu profesional de la salud tan pronto como sea posible: -Therapist, artreacciones alrgicas como erupcin cutnea, picazn o urticarias, hinchazn de la cara, labios o lengua -heces de color oscuro o con sangre, urina o vmito con sangre -pulso cardiaco rpido, irregular -sensacin de desmayos o mareos, cadas -alucinaciones, prdida del contacto con la realidad -convulsiones -ideas suicidas u otros cambios de humor -sangrado, magulladuras inusuales -cansancio o debilidad inusual -vmito Efectos secundarios que, por lo general, no requieren atencin mdica (debe informarlos a su mdico o a su profesional de la salud si persisten o si son molestos): -cambios del apetito -cambios en el deseo sexual o capacidad -diarrea -indigestin, nuseas -aumento de la sudoracin -temblores Puede ser que esta lista no menciona todos los posibles efectos secundarios.  Comunquese a su mdico por asesoramiento mdico Hewlett-Packardsobre los efectos secundarios. Usted puede informar los efectos secundarios a la FDA por telfono al 1-800-FDA-1088. Dnde debo guardar mi medicina? Mantngala fuera del alcance de los nios. Gurdela a Sanmina-SCItemperatura ambiente, entre 15 y 30 grados C (3559 y 7886 grados F). Deseche todo el  medicamento que no haya utilizado, despus de la fecha de vencimiento. ATENCIN: Este folleto es un resumen. Puede ser que no cubra toda la posible informacin. Si usted tiene preguntas acerca de esta medicina, consulte con su mdico, su farmacutico o su profesional de Radiographer, therapeuticla salud.  2015, Elsevier/Gold Standard. (2012-09-07 16:29:40) Depresin en el adulto  (Depression, Adult) La depresin es un sentimiento de tristeza, decaimiento, sufrimiento espiritual o vaco. Hay dos tipos de depresin:  1. La depresin que todos experimentamos de tanto en tanto debido a experiencias de la vida inquietantes, como la prdida del Elfin Foresttrabajo o el final de una relacin (tristeza normal o duelo normal). Este tipo de depresin se considera normal, es de corta duracin y se Furniture conservator/restorerresuelve entre unos [address]pocos das y 2 semanas. (La depresin que se experimenta tras la prdida de un ser querido se llama duelo. El duelo en general dura ms de 2 semanas, pero normalmente mejora con el tiempo). 2. La depresin clnica, es la que dura ms que la tristeza o duelo normal o la que interfiere con su capacidad de Counselling psychologistdesenvolverse en el hogar, en el trabajo y en la escuela. Tambin interfiere en las relaciones personales. Afecta casi todos los aspectos de la vida. La depresin clnica es una enfermedad. Los sntomas de depresin pueden tener su origen en otras afecciones que no sean la tristeza y el duelo o la depresin clnica. Ejemplos de estas afecciones son:   Lilia Arguenfermedades fsicas: Algunas enfermedades fsicas, incluyendo poca actividad de la glndula tiroides (hipotiroidismo), anemia grave, ciertos tipos de cncer,  diabetes, convulsiones incontrolables, problemas cardacos y pulmonares, ictus y Chief Technology Officerel dolor crnico se asocian con sntomas de depresin.  Efectos secundarios de algn medicamento recetado: En International aid/development workeralgunas personas, ciertos tipos de medicamentos recetados pueden causar sntomas de depresin.  Abuso de sustancias: El abuso de alcohol y drogas puede causar sntomas de depresin. SNTOMAS Los sntomas de tristeza y duelo normal son:   Lolita LenzSentirse triste o llorar durante perodos cortos de [place]-North[place]uetiempo.  Falta de preocupacin por todo (apata).  Dificultad para dormir o dormir demasiado.  No poder disfrutar de las cosas que antes disfrutaba.  El deseo de estar solo todo el tiempo (aislamiento social).  Falta de energa o motivacin.  Dificultad para concentrarse o recordar.  Cambios en el apetito o en el peso.  Inquietud o agitacin. Los sntomas de la depresin [place]clnica son los mismos de la tristeza o duelo normal y tambin Baxter Internationalincluyen los siguientes sntomas:   Sentirse triste o llorar todo el Uticatiempo.  Sentimientos de culpa o inutilidad.  Sentimientos de desesperanza o desamparo.  Pensamientos de suicidio o el deseo de daarse a s mismo (ideas suicidas).  Prdida de contacto con la realidad (sntomas psicticos). Ver o escuchar cosas que no son reales (alucinaciones)o tener creencias falsas acerca de su vida o de las personas que lo rodean (delirios y paranoia). DIAGNSTICO  El diagnstico de la depresin clnica se basa en la gravedad y duracin de los sntomas. El Office Depotmdico le har preguntas sobre su historia clnica y Rutledgeel uso de sustancias para determinar si una enfermedad fsica, el uso de medicamentos recetados, o el abuso de sustancias es la causa de su depresin. Su mdico tambin puede indicar anlisis de Paragonsangre.  TRATAMIENTO  Por lo general, la tristeza y el duelo normal no requieren tratamiento. Pero a veces se recetan antidepresivos durante el duelo para Eastman Kodakaliviar los sntomas de depresin  hasta que se resuelven.  El tratamiento para la depresin clnica depende de la gravedad de los sntomas,  pero suele incluir antidepresivos, psicoterapia con un profesional de la salud mental o una combinacin de [place]. El Office Depot ayudar a Chief Strategy Officer qu tratamiento es el mejor para usted.  La depresin causada por una enfermedad fsica generalmente desaparece con tratamiento mdico adecuado de la enfermedad. Si un medicamento recetado le causa depresin, hable con su mdico acerca de suspenderlo, disminuir la dosis o sustituirlo por otro medicamento.  La depresin causada por el abuso de alcohol o abuso de drogas ilcitas se va con la abstinencia de estas sustancias. Algunos adultos necesitan ayuda profesional con el fin de dejar de beber o usar drogas.  SOLICITE ATENCIN MDICA DE INMEDIATO SI:   Tiene pensamientos acerca de lastimarse o daar a Economist.  Pierde el contacto con la realidad (tiene sntomas psicticos).  Est tomando medicamentos para la depresin y tiene Hospital doctor graves. Irven Shelling MS INFORMACIN National Alliance on Mental Illness: www.nami.[employer] Corporation of Mental Health: http://www.maynard.net/  Document Released: 12/20/2004 Document Revised: 06/21/2011 ExitCare Patient Information 2015 [place], [place]. This information is not intended to replace advice given to you by your health care provider. Make sure you discuss any questions you have with your health care provider. Colonoscopa (Colonoscopy) Neomia Dear colonoscopa es un examen que se realiza para examinar todo el intestino grueso (colon). Este examen puede ayudar a Engineer, manufacturing problemas, como tumores, plipos, inflamacin y reas de Restaurant manager, fast food. El examen dura aproximadamente 1hora.  INFORME A SU MDICO:   Cualquier alergia que tenga.  Todos los Walt Disney, incluidos vitaminas, hierbas, gotas oftlmicas, cremas y [address] de [address].  Problemas previos que usted o los  [employer] de su familia hayan tenido con el uso de anestsicos.  Enfermedades de [employer].  Cirugas previas.  Enfermedades patolgicas. RIESGOS Y COMPLICACIONES  En general, se trata de un procedimiento seguro. Sin embargo, Tree surgeon procedimiento, pueden surgir complicaciones. Las complicaciones posibles son:  Hemorragias.  Desgarro o ruptura de la pared del colon.  Reaccin a los Systems developer.  Infeccin (raro). ANTES DEL PROCEDIMIENTO   Consulte a su mdico si debe cambiar o suspender los medicamentos que toma habitualmente.  Posiblemente se le recete una preparacin del colon por va oral. Esto incluye beber una gran cantidad de lquido medicinal desde el da anterior a su procedimiento. El lquido har que elimine muchas heces blandas hasta que sean casi claras o de color verdoso claro. De esta manera limpiar el colon para prepararlo para el procedimiento.  No coma ni beba nada ms una vez que haya comenzado con la preparacin del colon, a menos que el mdico le indique que es seguro [place].  Pdale a alguna persona que la lleve a su casa luego del procedimiento. PROCEDIMIENTO   Le administrarn un medicamento para que pueda relajarse (sedante).  Se recostar de costado con las rodillas flexionadas.  Se insertar un tubo largo y flexible con Neomia Dear luz y Neomia Dear cmara en el extremo (colonoscopio) a travs del recto y dentro del colon. La cmara enviar el video hacia una pantalla de computadora a medida que se vaya moviendo por el colon. El colonoscopio tambin libera dixido de carbono para inflar el colon. Esto ayuda a que el mdico pueda ver mejor el rea.  Durante el examen, es posible que su mdico tome una pequea muestra de tejido (biopsia) para examinarla bajo el microscopio, si se encuentran anormalidades.  El examen finaliza cuando se ha examinado todo el colon. DESPUS DEL PROCEDIMIENTO   No conduzca vehculos durante  las  24horas posteriores al examen.  Es posible que encuentre una pequea cantidad de sangre en la materia fecal.  Gretchen Short tenga cantidades moderadas de gases y calambres o hinchazn abdominales leves. Esto se produce a causa del gas utilizado para inflar el colon durante el examen.  Pregunte cundo estarn [employer] del examen y cmo los obtendr. Asegrese de [place]. Document Released: 09/29/2004 Document Revised: 10/10/2012 [facility] Patient Information 2015 [place], [place]. This information is not intended to replace advice given to you by your health care provider. Make sure you discuss any questions you have with your health care provider.

## 2013-06-25 NOTE — Progress Notes (Signed)
[name] 01/22/1955 [id number]   History:    58 y.o.  for annual gyn exam who has brought up to my attention that she feels depressed and withdrawn and episodes of crying several times during the day. She is in a difficult marriage and she is no respect for her single parent and daughter who lives with her as well. She has no suicidal ideation. Patient has been followed by Dr. Drue NovelPaz for her hypertension. Patient with prior history of Xanax overdose and had been admitted to the hospital. She is currently not seeing a therapist.  Review of her record indicated she has a history of cerebrovascular accident (cerebral infarction) in the past.  Patient has still not had her colonoscopy. Her mammogram is overdue as well. Patient has been menopausal for past 3 months she has not had a menstrual cycle. She is not complaining of any vasomotor symptoms.  Past medical history,surgical history, family history and social history were all reviewed and documented in the EPIC chart.  Gynecologic History Patient's last menstrual period was 03/25/2013. Contraception: tubal ligation Last Pap: 2012. Results were: normal Last mammogram: 2008. Results were: normal  Obstetric History OB History  Gravida Para Term Preterm AB SAB TAB Ectopic Multiple Living  5 3 3  2 2    3     # Outcome Date GA Lbr Len/2nd Weight Sex Delivery Anes PTL Lv  5 SAB           4 SAB           3 TRM     F CS  N Y  2 TRM     M SVD  N Y  1 TRM     F SVD  N Y       ROS: A ROS was performed and pertinent positives and negatives are included in the history.  GENERAL: No fevers or chills. HEENT: No change in vision, no earache, sore throat or sinus congestion. NECK: No pain or stiffness. CARDIOVASCULAR: No chest pain or pressure. No palpitations. PULMONARY: No shortness of breath, cough or wheeze. GASTROINTESTINAL: No abdominal pain, nausea, vomiting or diarrhea, melena or bright red blood per rectum. GENITOURINARY: No urinary  frequency, urgency, hesitancy or dysuria. MUSCULOSKELETAL: No joint or muscle pain, no back pain, no recent trauma. DERMATOLOGIC: No rash, no itching, no lesions. ENDOCRINE: No polyuria, polydipsia, no heat or cold intolerance. No recent change in weight. HEMATOLOGICAL: No anemia or easy bruising or bleeding. NEUROLOGIC: No headache, seizures, numbness, tingling or weakness. PSYCHIATRIC: No depression, no loss of interest in normal activity or change in sleep pattern.     Exam: chaperone present  BP 130/80  Ht 5' 2.5" (1.588 m)  Wt 156 lb (70.761 kg)  BMI 28.06 kg/m2  LMP 03/25/2013  Body mass index is 28.06 kg/(m^2).  General appearance : Well developed well nourished female. No acute distress HEENT: Neck supple, trachea midline, no carotid bruits, no thyroidmegaly Lungs: Clear to auscultation, no rhonchi or wheezes, or rib retractions  Heart: Regular rate and rhythm, no murmurs or gallops Breast:Examined in sitting and supine position were symmetrical in appearance, no palpable masses or tenderness,  no skin retraction, no nipple inversion, no nipple discharge, no skin discoloration, no axillary or supraclavicular lymphadenopathy Abdomen: no palpable masses or tenderness, no rebound or guarding Extremities: no edema or skin discoloration or tenderness  Pelvic:  Bartholin, Urethra, Skene Glands: Within normal limits  Vagina: No gross lesions or discharge  Cervix: No gross lesions or discharge  Uterus  anteverted, normal size, shape and consistency, non-tender and mobile  Adnexa  Without masses or tenderness  Anus and perineum  normal   Rectovaginal  normal sphincter tone without palpated masses or tenderness             Hemoccult referred for colonoscopy     Assessment/Plan:  58 y.o. female for annual exam who is menopausal but with no visual motor symptoms. Because of her depression she is going to be started on Zoloft 50 mg daily. She is going to be referred to the  psychologist who speak Spanish to help her cope with her current family life situation. Patient states that she is not having any physical or verbal abuse. She was given a requisition to schedule her mammogram and was given the name of our GI colleague for her to schedule an appointment for her baseline colonoscopy. Pap smear was drawn today. Her PCP will be drawn her blood work and she will be seeing him in a few weeks as well. We discussed importance of calcium and vitamin D and regular exercise for osteoporosis prevention. We discussed the importance of monthly self breast examination. Literature and information was provided in Spanish on depression as well as on the medications Zoloft.  Note: This dictation was prepared with  Dragon/digital dictation along withSmart phrase technology. Any transcriptional errors that result from this process are unintentional.   [name],JUAN H MD, 12:01 PM 06/25/2013

## 2013-06-26 ENCOUNTER — Other Ambulatory Visit (INDEPENDENT_AMBULATORY_CARE_PROVIDER_SITE_OTHER): Payer: BC Managed Care – PPO

## 2013-06-26 DIAGNOSIS — D72829 Elevated white blood cell count, unspecified: Secondary | ICD-10-CM

## 2013-06-26 LAB — CBC
HEMATOCRIT: 34.8 % — AB (ref 36.0–46.0)
HEMOGLOBIN: 11.5 g/dL — AB (ref 12.0–15.0)
MCHC: 33.1 g/dL (ref 30.0–36.0)
MCV: 89.1 fl (ref 78.0–100.0)
Platelets: 253 10*3/uL (ref 150.0–400.0)
RBC: 3.91 Mil/uL (ref 3.87–5.11)
RDW: 13.4 % (ref 11.5–15.5)
WBC: 7.5 10*3/uL (ref 4.0–10.5)

## 2013-06-26 LAB — CYTOLOGY - PAP

## 2013-07-02 ENCOUNTER — Encounter: Payer: Self-pay | Admitting: *Deleted

## 2013-10-01 ENCOUNTER — Encounter: Payer: BC Managed Care – PPO | Admitting: Internal Medicine

## 2013-10-01 DIAGNOSIS — Z0289 Encounter for other administrative examinations: Secondary | ICD-10-CM

## 2013-11-04 ENCOUNTER — Encounter: Payer: Self-pay | Admitting: Gynecology

## 2013-11-26 ENCOUNTER — Other Ambulatory Visit: Payer: Self-pay | Admitting: Internal Medicine

## 2013-12-31 ENCOUNTER — Other Ambulatory Visit: Payer: Self-pay | Admitting: Internal Medicine

## 2014-02-02 ENCOUNTER — Other Ambulatory Visit: Payer: Self-pay | Admitting: Internal Medicine

## 2014-03-03 ENCOUNTER — Other Ambulatory Visit: Payer: Self-pay

## 2014-03-04 ENCOUNTER — Ambulatory Visit: Payer: Self-pay | Admitting: Internal Medicine

## 2014-03-04 ENCOUNTER — Ambulatory Visit (INDEPENDENT_AMBULATORY_CARE_PROVIDER_SITE_OTHER): Payer: BLUE CROSS/BLUE SHIELD | Admitting: Internal Medicine

## 2014-03-04 ENCOUNTER — Encounter: Payer: Self-pay | Admitting: Internal Medicine

## 2014-03-04 VITALS — BP 111/70 | HR 56 | Temp 98.2°F | Ht 62.0 in | Wt 158.4 lb

## 2014-03-04 DIAGNOSIS — I10 Essential (primary) hypertension: Secondary | ICD-10-CM

## 2014-03-04 DIAGNOSIS — F329 Major depressive disorder, single episode, unspecified: Secondary | ICD-10-CM

## 2014-03-04 DIAGNOSIS — E876 Hypokalemia: Secondary | ICD-10-CM

## 2014-03-04 DIAGNOSIS — F32A Depression, unspecified: Secondary | ICD-10-CM

## 2014-03-04 DIAGNOSIS — D649 Anemia, unspecified: Secondary | ICD-10-CM

## 2014-03-04 MED ORDER — AMLODIPINE BESYLATE 10 MG PO TABS: 10.0000 mg | ORAL_TABLET | Freq: Every day | ORAL | Status: DC

## 2014-03-04 MED ORDER — SERTRALINE HCL 50 MG PO TABS: 50.0000 mg | ORAL_TABLET | Freq: Every day | ORAL | Status: DC

## 2014-03-04 MED ORDER — LOSARTAN POTASSIUM-HCTZ 100-25 MG PO TABS: ORAL_TABLET | ORAL | Status: DC

## 2014-03-04 MED ORDER — METOPROLOL SUCCINATE ER 50 MG PO TB24: ORAL_TABLET | ORAL | Status: DC

## 2014-03-04 NOTE — Assessment & Plan Note (Signed)
Reports anemia is a chronic issue, has heavy bleedings, GI review systems negative. Likely etiology is DUB. Plan:  CBC, iron, ferritin, vitamins. Consider GI referral, never had a colonoscopy and she is [id number]

## 2014-03-04 NOTE — Progress Notes (Signed)
Subjective:    Patient ID: [name], female    DOB: 11/01/1955, 59 y.o.   MRN: [id number]  DOS:  03/04/2014 Type of visit - description : Routine follow-up, here with her daughter Interval history: Hypertension, good compliance with medications, not ambulatory BPs. History of anemia, reports she is a chronic problem, not taking any supplements. Depression, self discontinued Zoloft, "I don't like to take many medications". Reports that she gets sad/down/depressed on and off. Elevated white count, resolved, see last CBC    Review of Systems Denies nausea, vomiting, diarrhea, no blood in the stools or abdominal pain Admits to irregular periods, sometimes every 2 or 3 months, when they do come, they last one or 2 weeks. Last period was 2 months ago. No suicidal ideas   Past Medical History  Diagnosis Date  . Anemia   . Hypertension   . Hypokalemia   . Headache(784.0)   . Anxiety and depression     admited w/ xanax overdose 6-12, Rx lexapro per psych  . CVA (cerebral infarction)     per MRI 07-2010  . NSVD (normal spontaneous vaginal delivery)     X3  . Missed ab     Past Surgical History  Procedure Laterality Date  . Appendectomy    . Cesarean section    . Tubal ligation    . Bladder surgery    . Bladder suspension      [facility]  . Endometrial ablation      HER OPTION    History   Social History  . Marital Status: Married    Spouse Name: N/A  . Number of Children: 3  . Years of Education: N/A   Occupational History  . not working    Social History Main Topics  . Smoking status: Never Smoker   . Smokeless tobacco: Never Used  . Alcohol Use: No  . Drug Use: No  . Sexual Activity: Not on file   Other Topics Concern  . Not on file   Social History Narrative   [place]   5 g-kids   Diet: trying to eat healthy, low salt, no fast food    Exercise: no                 Medication List       This list is accurate as of:  03/04/14 11:59 PM.  Always use your most recent med list.               amLODipine 10 MG tablet  Commonly known as:  NORVASC  Take 1 tablet (10 mg total) by mouth daily.     aspirin 325 MG tablet  Take 325 mg by mouth daily.     losartan-hydrochlorothiazide 100-25 MG per tablet  Commonly known as:  HYZAAR  Take 1 tablet daily.     metoprolol succinate 50 MG 24 hr tablet  Commonly known as:  TOPROL-XL  TAKE 1 TABLET BY MOUTH DAILY WITH OR IMMEDIATELY FOLLOWING A MEAL.     potassium chloride SA 20 MEQ tablet  Commonly known as:  K-DUR,KLOR-CON  Take 1 tablet (20 mEq total) by mouth daily.     sertraline 50 MG tablet  Commonly known as:  ZOLOFT  Take 1 tablet (50 mg total) by mouth daily.           Objective:   Physical Exam BP 111/70 mmHg  Pulse 56  Temp(Src) 98.2 F (36.8 C) (Oral)  Ht 5'  2" (1.575 m)  Wt 158 lb 6 oz (71.838 kg)  BMI 28.96 kg/m2  SpO2 98% General:   Well developed, well nourished . NAD.  HEENT:  Normocephalic . Face symmetric, atraumatic Lungs:  CTA B Normal respiratory effort, no intercostal retractions, no accessory muscle use. Heart: RRR,  no murmur.  Abdomen: Not distended, soft, nontender Muscle skeletal: no pretibial edema bilaterally  Skin: Not pale. Not jaundice Neurologic:  alert & oriented X3.  Speech normal, gait appropriate for age and unassisted Psych--  Cognition and judgment appear intact.  Cooperative with normal attention span and concentration.  Behavior appropriate. No anxious or depressed appearing.      Assessment & Plan:

## 2014-03-04 NOTE — Patient Instructions (Signed)
Get your blood work before you leave   Restart sertraline 1 tablet daily  Considered to with counselor (Mr Coral SpikesJuan Santos)   Please go to the front and schedule a visit  In 3 months  for a physical exam     Come back fasting

## 2014-03-04 NOTE — Assessment & Plan Note (Signed)
Symptoms well-controlled, check a BMP, RFs

## 2014-03-04 NOTE — Assessment & Plan Note (Addendum)
Self discontinued Zoloft, she continue feeling sad/down on and off. Plan: Restart sertraline, also recommend to see a counselor. Information about a counselor that speaks Spanish provided Also, reports problems with memory sometimes, this will need to be addressed at a future visit

## 2014-03-04 NOTE — Progress Notes (Signed)
Pre visit review using our clinic review tool, if applicable. No additional management support is needed unless otherwise documented below in the visit note. 

## 2014-03-05 LAB — CBC WITH DIFFERENTIAL/PLATELET
Basophils Absolute: 0 10*3/uL (ref 0.0–0.1)
Basophils Relative: 0.3 % (ref 0.0–3.0)
Eosinophils Absolute: 0.2 10*3/uL (ref 0.0–0.7)
Eosinophils Relative: 2.8 % (ref 0.0–5.0)
HCT: 35 % — ABNORMAL LOW (ref 36.0–46.0)
HEMOGLOBIN: 12 g/dL (ref 12.0–15.0)
Lymphocytes Relative: 29.9 % (ref 12.0–46.0)
Lymphs Abs: 2.5 10*3/uL (ref 0.7–4.0)
MCHC: 34.3 g/dL (ref 30.0–36.0)
MCV: 85.4 fl (ref 78.0–100.0)
MONO ABS: 0.6 10*3/uL (ref 0.1–1.0)
Monocytes Relative: 6.8 % (ref 3.0–12.0)
NEUTROS PCT: 60.2 % (ref 43.0–77.0)
Neutro Abs: 5 10*3/uL (ref 1.4–7.7)
PLATELETS: 243 10*3/uL (ref 150.0–400.0)
RBC: 4.1 Mil/uL (ref 3.87–5.11)
RDW: 12.8 % (ref 11.5–15.5)
WBC: 8.2 10*3/uL (ref 4.0–10.5)

## 2014-03-05 LAB — BASIC METABOLIC PANEL
BUN: 23 mg/dL (ref 6–23)
CHLORIDE: 101 meq/L (ref 96–112)
CO2: 32 mEq/L (ref 19–32)
CREATININE: 0.92 mg/dL (ref 0.40–1.20)
Calcium: 9.6 mg/dL (ref 8.4–10.5)
GFR: 66.96 mL/min (ref >60.00)
GLUCOSE: 82 mg/dL (ref 70–99)
POTASSIUM: 3.2 meq/L — AB (ref 3.5–5.1)
Sodium: 140 mEq/L (ref 135–145)

## 2014-03-05 LAB — FERRITIN: Ferritin: 45.7 ng/mL (ref 10.0–291.0)

## 2014-03-05 LAB — VITAMIN B12: VITAMIN B 12: 315 pg/mL (ref 211–911)

## 2014-03-05 LAB — IRON: Iron: 68 ug/dL (ref 42–145)

## 2014-03-05 LAB — FOLATE: FOLATE: 13.8 ng/mL (ref >5.9)

## 2014-03-06 MED ORDER — POTASSIUM CHLORIDE CRYS ER 20 MEQ PO TBCR: 20.0000 meq | EXTENDED_RELEASE_TABLET | Freq: Every day | ORAL | Status: DC

## 2014-03-08 ENCOUNTER — Other Ambulatory Visit: Payer: Self-pay | Admitting: Internal Medicine

## 2014-04-03 ENCOUNTER — Telehealth: Payer: Self-pay | Admitting: Internal Medicine

## 2014-04-03 NOTE — Telephone Encounter (Signed)
LVM on phone for pt to return call.

## 2014-04-03 NOTE — Telephone Encounter (Signed)
Can you call Pt and let her know she is overdue for her potassium recheck from early March. Please have her schedule a lab appt. Thanks.

## 2014-04-03 NOTE — Telephone Encounter (Signed)
Please arrange a BMP, DX hypertension

## 2014-06-16 ENCOUNTER — Telehealth: Payer: Self-pay | Admitting: Internal Medicine

## 2014-06-16 NOTE — Telephone Encounter (Signed)
Pre Visit letter sent  °

## 2014-07-04 ENCOUNTER — Telehealth: Payer: Self-pay | Admitting: *Deleted

## 2014-07-04 NOTE — Telephone Encounter (Signed)
Unable to reach patient at time of Pre-Visit Call.  Left message for patient to return call when available.    

## 2014-07-08 ENCOUNTER — Encounter: Payer: Self-pay | Admitting: Internal Medicine

## 2014-07-10 ENCOUNTER — Encounter: Payer: Self-pay | Admitting: Internal Medicine

## 2014-07-10 ENCOUNTER — Telehealth: Payer: Self-pay | Admitting: Internal Medicine

## 2014-07-10 NOTE — Telephone Encounter (Signed)
Send no-show charge

## 2014-07-10 NOTE — Telephone Encounter (Signed)
Letter sent.

## 2014-07-10 NOTE — Telephone Encounter (Signed)
Pt was no show 07/08/14 10:00am, cpe appt, pt has not rescheduled, pt has several previous no shows.  1. Do you want me to mail letter? 2. Do you want to charge no show?

## 2015-11-19 ENCOUNTER — Encounter: Payer: Self-pay | Admitting: Gynecology

## 2015-11-19 ENCOUNTER — Ambulatory Visit (INDEPENDENT_AMBULATORY_CARE_PROVIDER_SITE_OTHER): Payer: 59 | Admitting: Gynecology

## 2015-11-19 VITALS — BP 130/86 | Ht 63.0 in | Wt 160.0 lb

## 2015-11-19 DIAGNOSIS — Z780 Asymptomatic menopausal state: Secondary | ICD-10-CM | POA: Diagnosis not present

## 2015-11-19 DIAGNOSIS — Z01419 Encounter for gynecological examination (general) (routine) without abnormal findings: Secondary | ICD-10-CM

## 2015-11-19 NOTE — Patient Instructions (Signed)
Colonoscopia en los adultos (Colonoscopy, Adult) Ardelia Mems colonoscopia es un examen que se realiza para examinar todo el intestino grueso. Durante el examen, se introduce un tubo lubricado y flexible en el ano que luego se lleva hasta el recto, el colon y otras partes del intestino grueso. A menudo se realiza una colonoscopia como parte de las pruebas normales de deteccin de cncer colorrectal o en respuesta a determinados sntomas, como anemia, diarrea persistente, dolor abdominal y Pharmacologist materia fecal. El examen puede ayudar a Hydrographic surveyor y diagnosticar problemas mdicos, entre ellos, los siguientes:  Tumores.  Plipos.  Inflamacin.  Zonas de hemorragias. INFORME A SU MDICO:  Cualquier alergia que tenga.  Todos los [employer], incluidos vitaminas, hierbas, gotas oftlmicas, cremas y medicamentos de venta libre.  Cualquier problema que usted o sus familiares hayan tenido con la anestesia.  Enfermedades de la sangre que tenga.  Cirugas previas.  Cualquier enfermedad que tenga.  Cualquier problema que haya tenido para defecar. RIESGOS Y COMPLICACIONES En general, se trata de un procedimiento seguro. Sin embargo, pueden presentarse problemas, por ejemplo:  Una hemorragia.  Un desgarro intestinal.  Ardelia Mems reaccin a los medicamentos administrados durante el examen.  Una infeccin (raro). ANTES DEL PROCEDIMIENTO Restricciones en las comidas y bebidas  Siga las indicaciones del mdico respecto de las comidas y las bebidas, las cuales pueden incluir lo siguiente:  Unos das antes del procedimiento, siga una dieta con bajo contenido de [place]. Evite los frutos secos, las semillas, las frutas disecadas, las frutas crudas y las verduras.  Siga una dieta de lquidos transparentes durante 1 a 3das antes del procedimiento. Beba solamente lquidos transparentes, como caldo o sopa transparentes, caf negro o t, jugos transparentes, refrescos o bebidas deportivas  transparentes, gelatina y helados de [place]. Evite los lquidos que contengan colorantes rojos o morados.  El da del procedimiento, no coma ni beba nada durante las 2horas previas a su realizacin o durante el lapso que el mdico le recomiende. Preparado intestinal  Si le recetaron un preparado intestinal por va oral para limpiar el colon:  Tmelo como se lo haya indicado el mdico. Desde el da anterior al procedimiento, tendr que beber una gran cantidad de lquido medicinal. El lquido har que elimine muchas heces blandas hasta que sean casi claras o de color verdoso claro.  Si la piel o el ano se le irritan debido a la diarrea, puede usar estos productos para Public house manager la irritacin:  Human resources officer, como las toallitas hmedas para adultos con aloe y vitaminaE.  Un producto que suavice la piel, como la vaselina.  Si vomita mientras toma el preparado intestinal, descanse durante un mximo de 26mnutos y empiece a tomarlo nuevamente. Si los vmitos continan y no puede tomar el preparado sin vomitar, llame al mdico. Instrucciones generales   Consulte a su mdico si debe cambiar o suspender los medicamentos que toma habitualmente. Esto es muy importante si toma medicamentos para la diabetes o anticoagulantes.  Haga planes para que una persona lo lleve a su casa desde el hospital o la clnica. PROCEDIMIENTO  Pueden colocarle una va intravenosa (IV) en una de las venas.  Recibir un medicamento como ayuda para relajarse (sedante).  Para reducir el riesgo de infecciones:  El equipo mdico se lavar o se dTransport planner  Le lavarn la zona anal con jabn.  Le pedirn que se recueste de costado con las rodillas flexionadas.  El mdico lubricar un tubo lSanta Claus delgado y flexible. El tubo tendr uCarlota Raspberry  y Hali Marry en el extremo.  Le introducirn el tubo en el ano.  El tubo se mover suavemente a travs del recto y el colon.  Le pondrn aire en el colon para  mantenerlo abierto. Es posible que sienta presin o clicos.  La cmara se usar para tomar Clinical research associate.  Pueden extraerle Radford Pax de tejido del cuerpo para examinarla con un microscopio (biopsia). Si se detecta cualquier problema posible, se enviar el tejido a un laboratorio para ser [employer].  Si se encuentran pequeos plipos, el mdico puede extirparlos y Oceanographer para detectar la presencia de clulas cancerosas.  Lentamente se retirar el tubo que le introdujeron en el ano. Este procedimiento puede variar segn el mdico y el hospital. DESPUS DEL PROCEDIMIENTO  Le controlarn la presin arterial, la frecuencia cardaca, la frecuencia respiratoria y Retail buyer de oxgeno en la sangre hasta que haya desaparecido el efecto de los medicamentos administrados.  No conduzca vehculos durante las 24horas posteriores al examen.  Es posible que encuentre una pequea cantidad de sangre en la materia fecal.  Puede eliminar gases y Best boy clicos abdominales o meteorismo leves debido al aire que se [place] para inflar el colon durante el examen.  Depende de usted [employer]  del procedimiento. Pregntele al mdico o consulte en el departamento donde se realice el procedimiento cundo estarn [employer]. Esta informacin no tiene Marine scientist el consejo del mdico. Asegrese de hacerle al mdico cualquier pregunta que tenga. Document Released: 09/29/2004 Document Revised: 04/13/2015 Document Reviewed: 03/03/2015 Elsevier Interactive Patient Education  2017 [place] sea (Bone Densitometry) La densitometra sea es un estudio de diagnstico por imgenes en el que se utiliza una radiografa especial que mide la cantidad de calcio y otros minerales en los huesos (densidad sea). Este estudio tambin se conoce como examen de densidad mineral sea o radioabsorciometra de doble energa (DEXA). Puede medir la densidad  sea en la cadera y la columna. Es similar a Education administrator. Tambin pueden hacerle este estudio para:  Diagnosticar una enfermedad que causa huesos dbiles o delgados (osteoporosis).  Predecir el riesgo de un hueso roto (fractura).  Determinar si el tratamiento para la osteoporosis funciona. INFORME A SU MDICO:  Cualquier alergia que tenga.  Todos los [employer], incluidos vitaminas, hierbas, gotas oftlmicas, cremas y medicamentos de venta libre.  Problemas previos que usted o los [employer] de su familia hayan tenido con el uso de anestsicos.  Enfermedades de la sangre que tenga.  Si tiene cirugas previas.  Enfermedades que tenga.  Probabilidad de embarazo.  Cualquier otro estudio mdico al que se haya sometido en los ltimos 14 das en el que se haya utilizado material de [place]. RIESGOS Y COMPLICACIONES En general, se trata de un procedimiento seguro. Sin embargo, pueden ocurrir [employer], entre los que se pueden incluir los siguientes:  Dana estudio lo expone a una cantidad muy pequea de radiacin.  Los riesgos de la exposicin a la radiacin pueden ser [employer] para los nios por Associate Professor. ANTES DEL PROCEDIMIENTO  No tome ningn suplemento de calcio durante 24 horas antes de [employer]. Puede comer y beber como lo hace habitualmente.  Qutese todas las joyas de metal, anteojos, aparatos dentales y cualquier otro objeto metlico. PROCEDIMIENTO  Deber recostarse en una camilla. Un generador de rayos X estar ubicado debajo de usted y un dispositivo de imgenes, por encima.  Se pueden usar otros dispositivos, como cajas o abrazaderas, para posicionar  el cuerpo apropiadamente para la exploracin.  Deber permanecer inmvil mientras la mquina explore lentamente su cuerpo.  Las imgenes se Web designer monitor de una computadora. DESPUS DEL PROCEDIMIENTO Es posible que necesite estudios adicionales ms adelante. Esta  informacin no tiene Marine scientist el consejo del mdico. Asegrese de hacerle al mdico cualquier pregunta que tenga. Document Released: 09/14/2011 Document Revised: 01/10/2014 Document Reviewed: 05/30/2013 Elsevier Interactive Patient Education  2017 [employer].

## 2015-11-19 NOTE — Progress Notes (Signed)
[name] 09/02/1955 [id number]   History:    60 y.o.  for annual gyn exam with no complaints today. Patient has not been seen the office since 2015. Patient reached the menopause several years ago and has not been any hormone replacement therapy and has very few of any hot flashes. [name] that she has not seen him in over a year. She does suffer from hypertension and skin[place] with prior history of Xanax overdose and had been admitted to the hospital. She is currently not seeing a therapist.  Review of her record indicated she has a history of cerebrovascular accident (cerebral infarction) in the past.  Patient has still not had her colonoscopy. Her mammogram is overdue as well.    Past medical history,surgical history, family history and social history were all reviewed and documented in the EPIC chart.  Gynecologic History Patient's last menstrual period was 03/25/2013. Contraception: post menopausal status Last Pap: 2015. Results were: normal Last mammogram: 2008. Results were: normal  Obstetric History OB History  Gravida Para Term Preterm AB Living  5 3 3   2 3   SAB TAB Ectopic Multiple Live Births  2       3    # Outcome Date GA Lbr Len/2nd Weight Sex Delivery Anes PTL Lv  5 SAB           4 SAB           3 Term     F CS-Unspec  N LIV  2 Term     M Vag-Spont  N LIV  1 Term     F Vag-Spont  N LIV       ROS: A ROS was performed and pertinent positives and negatives are included in the history.  GENERAL: No fevers or chills. HEENT: No change in vision, no earache, sore throat or sinus congestion. NECK: No pain or stiffness. CARDIOVASCULAR: No chest pain or pressure. No palpitations. PULMONARY: No shortness of breath, cough or wheeze. GASTROINTESTINAL: No abdominal pain, nausea, vomiting or diarrhea, melena or bright red blood per rectum. GENITOURINARY: No urinary frequency, urgency, hesitancy or  dysuria. MUSCULOSKELETAL: No joint or muscle pain, no back pain, no recent trauma. DERMATOLOGIC: No rash, no itching, no lesions. ENDOCRINE: No polyuria, polydipsia, no heat or cold intolerance. No recent change in weight. HEMATOLOGICAL: No anemia or easy bruising or bleeding. NEUROLOGIC: No headache, seizures, numbness, tingling or weakness. PSYCHIATRIC: No depression, no loss of interest in normal activity or change in sleep pattern.     Exam: chaperone present  BP 130/86   Ht 5\' 3"  (1.6 m)   Wt 160 lb (72.6 kg)   LMP 03/25/2013   BMI 28.34 kg/m   Body mass index is 28.34 kg/m.  General appearance : Well developed well nourished female. No acute distress HEENT: Eyes: no retinal hemorrhage or exudates,  Neck supple, trachea midline, no carotid bruits, no thyroidmegaly Lungs: Clear to auscultation, no rhonchi or wheezes, or rib retractions  Heart: Regular rate and rhythm, no murmurs or gallops Breast:Examined in sitting and supine position were symmetrical in appearance, no palpable masses or tenderness,  no skin retraction, no nipple inversion, no nipple discharge, no skin discoloration, no axillary or supraclavicular lymphadenopathy Abdomen: no palpable masses or tenderness, no rebound or guarding Extremities: no edema or skin discoloration or tenderness  Pelvic:  Bartholin, Urethra, Skene Glands: Within normal limits  Vagina: No gross lesions or discharge  Cervix: No gross lesions or discharge  Uterus  anteverted, normal size, shape and consistency, non-tender and mobile  Adnexa  Without masses or tenderness  Anus and perineum  normal   Rectovaginal  normal sphincter tone without palpated masses or tenderness             Hemoccult will schedule colonoscopy     Assessment/Plan:  60 y.o. female for annual exam who is overdue for colonoscopy will refer to gastroenterologist. Also patient provided with a requisition to schedule her overdue mammogram. Patient with past  history of). Patient also be scheduled for bone density study since she has not had a baseline and she's been menopausal several years. The following fasting screening blood work was ordered: Fasting lipid profile, comprehensive metabolic panel, TSH, CBC, and urinalysis. Pap smear not indicated this year. Patient declined flu vaccine.   [name],JUAN H MD, 2:51 PM 11/19/2015

## 2015-11-20 LAB — URINALYSIS W MICROSCOPIC + REFLEX CULTURE
Bacteria, UA: NONE SEEN [HPF]
Bilirubin Urine: NEGATIVE
CASTS: NONE SEEN [LPF]
Crystals: NONE SEEN [HPF]
Glucose, UA: NEGATIVE
Hgb urine dipstick: NEGATIVE
KETONES UR: NEGATIVE
LEUKOCYTES UA: NEGATIVE
Nitrite: NEGATIVE
PH: 5.5 (ref 5.0–8.0)
Protein, ur: NEGATIVE
RBC / HPF: NONE SEEN RBC/HPF (ref <=2)
SPECIFIC GRAVITY, URINE: 1.023 (ref 1.001–1.035)
WBC, UA: NONE SEEN WBC/HPF (ref <=5)
YEAST: NONE SEEN [HPF]

## 2015-11-23 ENCOUNTER — Other Ambulatory Visit: Payer: 59

## 2015-11-23 LAB — LIPID PANEL
Cholesterol: 188 mg/dL (ref <200)
HDL: 51 mg/dL (ref >50)
LDL Cholesterol: 115 mg/dL — ABNORMAL HIGH (ref <100)
TRIGLYCERIDES: 108 mg/dL (ref <150)
Total CHOL/HDL Ratio: 3.7 Ratio (ref <5.0)
VLDL: 22 mg/dL (ref <30)

## 2015-11-23 LAB — CBC WITH DIFFERENTIAL/PLATELET
BASOS ABS: 0 {cells}/uL (ref 0–200)
Basophils Relative: 0 %
EOS PCT: 3 %
Eosinophils Absolute: 234 cells/uL (ref 15–500)
HEMATOCRIT: 37 % (ref 35.0–45.0)
HEMOGLOBIN: 12.4 g/dL (ref 11.7–15.5)
LYMPHS PCT: 31 %
Lymphs Abs: 2418 cells/uL (ref 850–3900)
MCH: 28.8 pg (ref 27.0–33.0)
MCHC: 33.5 g/dL (ref 32.0–36.0)
MCV: 85.8 fL (ref 80.0–100.0)
MPV: 11.2 fL (ref 7.5–12.5)
Monocytes Absolute: 624 cells/uL (ref 200–950)
Monocytes Relative: 8 %
Neutro Abs: 4524 cells/uL (ref 1500–7800)
Neutrophils Relative %: 58 %
Platelets: 242 10*3/uL (ref 140–400)
RBC: 4.31 MIL/uL (ref 3.80–5.10)
RDW: 14.6 % (ref 11.0–15.0)
WBC: 7.8 10*3/uL (ref 3.8–10.8)

## 2015-11-23 LAB — TSH: TSH: 1.89 m[IU]/L

## 2015-11-23 LAB — COMPREHENSIVE METABOLIC PANEL
ALBUMIN: 4.4 g/dL (ref 3.6–5.1)
ALT: 15 U/L (ref 6–29)
AST: 15 U/L (ref 10–35)
Alkaline Phosphatase: 51 U/L (ref 33–130)
BUN: 21 mg/dL (ref 7–25)
CALCIUM: 9.4 mg/dL (ref 8.6–10.4)
CHLORIDE: 103 mmol/L (ref 98–110)
CO2: 29 mmol/L (ref 20–31)
Creat: 0.77 mg/dL (ref 0.50–1.05)
GLUCOSE: 86 mg/dL (ref 65–99)
Potassium: 3.4 mmol/L — ABNORMAL LOW (ref 3.5–5.3)
SODIUM: 141 mmol/L (ref 135–146)
Total Bilirubin: 1 mg/dL (ref 0.2–1.2)
Total Protein: 7.3 g/dL (ref 6.1–8.1)

## 2015-12-26 ENCOUNTER — Other Ambulatory Visit: Payer: Self-pay | Admitting: Internal Medicine

## 2016-01-07 ENCOUNTER — Ambulatory Visit (INDEPENDENT_AMBULATORY_CARE_PROVIDER_SITE_OTHER): Payer: 59 | Admitting: Medical

## 2016-01-07 VITALS — BP 150/86 | HR 52 | Temp 98.0°F | Wt 161.0 lb

## 2016-01-07 DIAGNOSIS — J029 Acute pharyngitis, unspecified: Secondary | ICD-10-CM | POA: Diagnosis not present

## 2016-01-07 DIAGNOSIS — J209 Acute bronchitis, unspecified: Secondary | ICD-10-CM | POA: Diagnosis not present

## 2016-01-07 DIAGNOSIS — J040 Acute laryngitis: Secondary | ICD-10-CM

## 2016-01-07 MED ORDER — BENZONATATE 100 MG PO CAPS
100.0000 mg | ORAL_CAPSULE | Freq: Three times a day (TID) | ORAL | 0 refills | Status: DC | PRN
Start: 2016-01-07 — End: 2016-03-28

## 2016-01-07 MED ORDER — FLUTICASONE PROPIONATE 50 MCG/ACT NA SUSP: 2.0000 | Freq: Every day | NASAL | 1 refills | Status: DC

## 2016-01-07 MED ORDER — AZITHROMYCIN 250 MG PO TABS: ORAL_TABLET | ORAL | 0 refills | Status: DC

## 2016-01-07 NOTE — Progress Notes (Signed)
Pre visit review using our clinic review tool, if applicable. No additional management support is needed unless otherwise documented below in the visit note. 

## 2016-01-07 NOTE — Progress Notes (Signed)
Subjective:    Patient ID: [name], female    DOB: [birth year], 61 y.o.   MRN: [id number]  HPI  Pt in states since Sunday had had some mild hoarse voice, nasal congestion and soar throat. At first pain was severe swollen and some lymph nodes swelling. Pt states Sunday fever.  Pt feels like voice is recuperating. Pt does not talk much at work.  Pt did not work [employer] or Wednesday.  Pt coughs mostly dry but occasionally productive with minimal blood tinge. Pt non smoker.     Review of Systems  Constitutional: Positive for fever. Negative for chills and fatigue.  HENT: Positive for congestion, sore throat and trouble swallowing.   Respiratory: Negative for cough, choking, shortness of breath and wheezing.   Cardiovascular: Negative for chest pain and palpitations.  Gastrointestinal: Negative for abdominal pain.  Musculoskeletal: Negative for back pain and myalgias.  Skin: Negative for rash.  Neurological: Negative for dizziness and headaches.  Hematological: Negative for adenopathy. Does not bruise/bleed easily.  Psychiatric/Behavioral: Negative for behavioral problems and confusion.    Past Medical History:  Diagnosis Date  . Anemia   . Anxiety and depression    admited w/ xanax overdose 6-12, Rx lexapro per psych  . Arthritis   . CVA (cerebral infarction)    per MRI 07-2010  . Headache(784.0)   . Hypertension   . Hypokalemia   . Missed ab   . NSVD (normal spontaneous vaginal delivery)    X3     Social History   Social History  . Marital status: Married    Spouse name: N/A  . Number of children: 3  . Years of education: N/A   Occupational History  . not working    Social History Main Topics  . Smoking status: Never Smoker  . Smokeless tobacco: Never Used  . Alcohol use No  . Drug use: No  . Sexual activity: Yes   Other Topics Concern  . Not on file   Social History Narrative   From [place]   5 g-kids   Diet: trying to eat  healthy, low salt, no fast food    Exercise: no             Past Surgical History:  Procedure Laterality Date  . APPENDECTOMY    . BLADDER SURGERY    . BLADDER SUSPENSION     DONE IN [facility]  . CESAREAN SECTION    . ENDOMETRIAL ABLATION     HER OPTION  . TUBAL LIGATION      Family History  Problem Relation Age of Onset  . Heart attack Father 64  . Diabetes      aunt  . Hypertension      many family members  . Breast cancer      distant relative   . Colon cancer Neg Hx     Allergies  Allergen Reactions  . Lisinopril     REACTION: cough    Current Outpatient Prescriptions on File Prior to Visit  Medication Sig Dispense Refill  . amLODipine (NORVASC) 10 MG tablet Take 1 tablet (10 mg total) by mouth daily. 30 tablet 0  . aspirin 325 MG tablet Take 325 mg by mouth daily.      Marland Kitchen losartan-hydrochlorothiazide (HYZAAR) 100-25 MG per tablet Take 1 tablet daily. 30 tablet 6  . metoprolol succinate (TOPROL-XL) 50 MG 24 hr tablet TAKE 1 TABLET BY MOUTH DAILY WITH OR IMMEDIATELY FOLLOWING A MEAL. 30 tablet 6  .  potassium chloride SA (K-DUR,KLOR-CON) 20 MEQ tablet Take 1 tablet (20 mEq total) by mouth daily. (Patient not taking: Reported on 11/19/2015) 30 tablet 1  . sertraline (ZOLOFT) 50 MG tablet Take 1 tablet (50 mg total) by mouth daily. (Patient not taking: Reported on 11/19/2015) 30 tablet 6   No current facility-administered medications on file prior to visit.     BP (!) 150/86 (BP Location: Left Arm, Patient Position: Sitting, Cuff Size: Normal) Comment: Pt states she has not taken meds today.  Pulse (!) 52   Temp 98 F (36.7 C)   Wt 161 lb (73 kg)   LMP 03/25/2013   SpO2 98%   BMI 28.52 kg/m       Objective:   Physical Exam  General  Mental Status - Alert. General Appearance - Well groomed. Not in acute distress.  Skin Rashes- No Rashes.  HEENT Head- Normal. Ear Auditory Canal - Left- Normal. Right - Normal.Tympanic Membrane- Left- Normal.  Right- Normal. Eye Sclera/Conjunctiva- Left- Normal. Right- Normal. Nose & Sinuses Nasal Mucosa- Left-  Boggy and Congested. Right-  Boggy and  Congested.Bilateral no  maxillary and no  frontal sinus pressure. Mouth & Throat Lips: Upper Lip- Normal: no dryness, cracking, pallor, cyanosis, or vesicular eruption. Lower Lip-Normal: no dryness, cracking, pallor, cyanosis or vesicular eruption. Buccal Mucosa- Bilateral- No Aphthous ulcers. Oropharynx- No Discharge or Erythema. Tonsils: Characteristics- Bilateral- Moderate  Erythema or Congestion. Size/Enlargement- Bilateral- 1+. Discharge- bilateral-None.  Neck Neck- Supple. No Masses.   Chest and Lung Exam Auscultation: Breath Sounds:-Clear even and unlabored.  Cardiovascular Auscultation:Rythm- Regular, rate and rhythm. Murmurs & Other Heart Sounds:Ausculatation of the heart reveal- No Murmurs.  Lymphatic Head & Neck General Head & Neck Lymphatics: Bilateral: Description- bilateral mild enlarged submandibular lymph node.Rt side mild larger than left.       Assessment & Plan:  Your voice seems to be recovering. Continue to rest your voice.  For your persisting sore throat and potential bronchitis rx azithromycin.  For cough rx benzonatate. For nasal congestion rx flonase.  Follow up 7 days or as needed  Erum Cercone, Ramon DredgeEdward, VF CorporationPA-C

## 2016-01-07 NOTE — Patient Instructions (Signed)
Your voice seems to be recovering. Continue to rest your voice.  For your persisting sore throat and potential bronchitis rx azithromycin.  For cough rx benzonatate. For nasal congestion rx flonase.  Follow up 7 days or as needed

## 2016-01-11 ENCOUNTER — Ambulatory Visit: Payer: Self-pay | Admitting: Medical

## 2016-03-28 ENCOUNTER — Ambulatory Visit (INDEPENDENT_AMBULATORY_CARE_PROVIDER_SITE_OTHER): Payer: 59 | Admitting: Internal Medicine

## 2016-03-28 ENCOUNTER — Encounter: Payer: Self-pay | Admitting: Internal Medicine

## 2016-03-28 ENCOUNTER — Ambulatory Visit (HOSPITAL_BASED_OUTPATIENT_CLINIC_OR_DEPARTMENT_OTHER)
Admission: RE | Admit: 2016-03-28 | Discharge: 2016-03-28 | Disposition: A | Discharge status: Home / Self Care | Payer: 59 | Source: Ambulatory Visit | Attending: Internal Medicine | Admitting: Internal Medicine

## 2016-03-28 VITALS — BP 124/62 | HR 61 | Temp 98.2°F | Resp 14 | Ht 63.0 in | Wt 162.5 lb

## 2016-03-28 DIAGNOSIS — M546 Pain in thoracic spine: Secondary | ICD-10-CM | POA: Diagnosis not present

## 2016-03-28 DIAGNOSIS — I10 Essential (primary) hypertension: Secondary | ICD-10-CM

## 2016-03-28 LAB — POC URINALSYSI DIPSTICK (AUTOMATED)
Bilirubin, UA: NEGATIVE
Glucose, UA: NEGATIVE
LEUKOCYTES UA: NEGATIVE
NITRITE UA: NEGATIVE
PH UA: 6 (ref 5.0–8.0)
RBC UA: NEGATIVE
Spec Grav, UA: 1.03 (ref 1.030–1.035)
UROBILINOGEN UA: 0.2 (ref ?–2.0)

## 2016-03-28 MED ORDER — CYCLOBENZAPRINE HCL 10 MG PO TABS: 10.0000 mg | ORAL_TABLET | Freq: Every evening | ORAL | 0 refills | Status: DC | PRN

## 2016-03-28 MED ORDER — METOPROLOL SUCCINATE ER 50 MG PO TB24: 50.0000 mg | ORAL_TABLET | Freq: Every day | ORAL | 5 refills | Status: DC

## 2016-03-28 MED ORDER — LOSARTAN POTASSIUM-HCTZ 100-25 MG PO TABS: 1.0000 | ORAL_TABLET | Freq: Every day | ORAL | 5 refills | Status: DC

## 2016-03-28 MED ORDER — POTASSIUM CHLORIDE CRYS ER 20 MEQ PO TBCR: 20.0000 meq | EXTENDED_RELEASE_TABLET | Freq: Every day | ORAL | 5 refills | Status: DC

## 2016-03-28 MED ORDER — AMLODIPINE BESYLATE 10 MG PO TABS: 10.0000 mg | ORAL_TABLET | Freq: Every day | ORAL | 5 refills | Status: DC

## 2016-03-28 NOTE — Progress Notes (Signed)
Subjective:    Patient ID: [name], female    DOB: 02/17/1955, 61 y.o.   MRN: [id number]  DOS:  03/28/2016 Type of visit - description : acute Interval history: Here due to pain, symptoms started 2 weeks ago, pain is located on the right thoracic lateral area. Pain is steady, worse with certain movements. She denies any injury or rash. Last OV > than a year, she lost her insurance but is back working. Reports good compliance with 3 BP medications but takes potassium sporadically.   Review of Systems  Denies any fever chills. No neck pain per se No rash on the chest or abdomen. No cough or difficulty breathing. No weight loss No fall or injury No recent airplane trip or calf pain No pain of the abdomen, dysuria or gross hematuria.  Past Medical History:  Diagnosis Date  . Anemia   . Anxiety and depression    admited w/ xanax overdose 6-12, Rx lexapro per psych  . Arthritis   . CVA (cerebral infarction)    per MRI 07-2010  . Headache(784.0)   . Hypertension   . Hypokalemia   . Missed ab   . NSVD (normal spontaneous vaginal delivery)    X3    Past Surgical History:  Procedure Laterality Date  . APPENDECTOMY    . BLADDER SURGERY    . BLADDER SUSPENSION     [facility]  . CESAREAN SECTION    . ENDOMETRIAL ABLATION     HER OPTION  . TUBAL LIGATION      Social History   Social History  . Marital status: Married    Spouse name: N/A  . Number of children: 3  . Years of education: N/A   Occupational History  . works, Marine scientistfactory    Social History Main Topics  . Smoking status: Never Smoker  . Smokeless tobacco: Never Used  . Alcohol use No  . Drug use: No  . Sexual activity: Yes   Other Topics Concern  . Not on file   Social History Narrative   From [place]   5 g-kids       Allergies as of 03/28/2016      Reactions   Lisinopril    REACTION: cough      Medication List       Accurate as of 03/28/16 11:59 PM. Always use  your most recent med list.          amLODipine 10 MG tablet Commonly known as:  NORVASC Take 1 tablet (10 mg total) by mouth daily.   aspirin EC 81 MG tablet Take 81 mg by mouth daily.   cyclobenzaprine 10 MG tablet Commonly known as:  FLEXERIL Take 1 tablet (10 mg total) by mouth at bedtime as needed for muscle spasms.   losartan-hydrochlorothiazide 100-25 MG tablet Commonly known as:  HYZAAR Take 1 tablet by mouth daily.   metoprolol succinate 50 MG 24 hr tablet Commonly known as:  TOPROL-XL Take 1 tablet (50 mg total) by mouth daily. Take with or immediately following a meal.   potassium chloride SA 20 MEQ tablet Commonly known as:  K-DUR,KLOR-CON Take 1 tablet (20 mEq total) by mouth daily.          Objective:   Physical Exam  Musculoskeletal:       Arms:  BP 124/62 (BP Location: Left Arm, Patient Position: Sitting, Cuff Size: Normal)   Pulse 61   Temp 98.2 F (36.8 C) (Oral)  Resp 14   Ht 5\' 3"  (1.6 m)   Wt 162 lb 8 oz (73.7 kg)   LMP 03/25/2013   SpO2 98%   BMI 28.79 kg/m  General:   Well developed, well nourished . NAD.  HEENT:  Normocephalic . Face symmetric, atraumatic Lungs:  CTA B Normal respiratory effort, no intercostal retractions, no accessory muscle use. Heart: RRR,  no murmur.  no pretibial edema bilaterally  Abdomen:  Not distended, soft, non-tender. No rebound or rigidity.  Skin: Not pale. Not jaundice Neurologic:  alert & oriented X3.  Speech normal, gait appropriate for age and unassisted Psych--  Cognition and judgment appear intact.  Cooperative with normal attention span and concentration.  Behavior appropriate. No anxious or depressed appearing.    Assessment & Plan:   Assessment HTN Hyperlipidemia Anxiety depression: Xanax overdose 06/2010, used to see psychiatry CVA per MRI 07-2010 h/o  anemia, referred to GI 2016  Plan: Right thoracic pain: Likely MSK, ROS is benign. Exam benign. Udip trace proteins. Will get a  chest x-ray, UA and recommend Tylenol, Flexeril. HTN: Seems to be controlled with metoprolol, Hyzaar and amlodipine. Takes potassium supplements inconsistently. Recommend to take them every day, check a BMP and CBC. Refill medications RTC 6 months

## 2016-03-28 NOTE — Patient Instructions (Signed)
GO TO THE LAB : Get the blood work     GO TO THE FRONT DESK Schedule your next appointment for a  checkup in 6 months   STOP BY THE FIRST FLOOR:  get the XR   For pain: Take Tylenol 500 mg 2 tablets every 8 hours as needed Flexeril, a muscle relaxant at night Call if no better in the next 2 or 3 weeks. Call anytime if you have fever, rash or severe symptoms

## 2016-03-28 NOTE — Progress Notes (Signed)
Pre visit review using our clinic review tool, if applicable. No additional management support is needed unless otherwise documented below in the visit note. 

## 2016-03-29 DIAGNOSIS — Z09 Encounter for follow-up examination after completed treatment for conditions other than malignant neoplasm: Secondary | ICD-10-CM | POA: Insufficient documentation

## 2016-03-29 LAB — COMPREHENSIVE METABOLIC PANEL
ALBUMIN: 4.6 g/dL (ref 3.5–5.2)
ALT: 16 U/L (ref 0–35)
AST: 19 U/L (ref 0–37)
Alkaline Phosphatase: 63 U/L (ref 39–117)
BUN: 28 mg/dL — AB (ref 6–23)
CHLORIDE: 102 meq/L (ref 96–112)
CO2: 31 mEq/L (ref 19–32)
CREATININE: 1.07 mg/dL (ref 0.40–1.20)
Calcium: 9.5 mg/dL (ref 8.4–10.5)
GFR: 55.84 mL/min — ABNORMAL LOW (ref >60.00)
GLUCOSE: 97 mg/dL (ref 70–99)
Potassium: 3.4 mEq/L — ABNORMAL LOW (ref 3.5–5.1)
SODIUM: 139 meq/L (ref 135–145)
TOTAL PROTEIN: 7.8 g/dL (ref 6.0–8.3)
Total Bilirubin: 0.6 mg/dL (ref 0.2–1.2)

## 2016-03-29 LAB — CBC WITH DIFFERENTIAL/PLATELET
BASOS PCT: 0.4 % (ref 0.0–3.0)
Basophils Absolute: 0 10*3/uL (ref 0.0–0.1)
EOS ABS: 0.3 10*3/uL (ref 0.0–0.7)
EOS PCT: 2.9 % (ref 0.0–5.0)
HCT: 37.8 % (ref 36.0–46.0)
Hemoglobin: 12.5 g/dL (ref 12.0–15.0)
LYMPHS ABS: 3.3 10*3/uL (ref 0.7–4.0)
Lymphocytes Relative: 30 % (ref 12.0–46.0)
MCHC: 33.2 g/dL (ref 30.0–36.0)
MCV: 87 fl (ref 78.0–100.0)
MONO ABS: 0.8 10*3/uL (ref 0.1–1.0)
Monocytes Relative: 7.7 % (ref 3.0–12.0)
NEUTROS ABS: 6.4 10*3/uL (ref 1.4–7.7)
NEUTROS PCT: 59 % (ref 43.0–77.0)
PLATELETS: 281 10*3/uL (ref 150.0–400.0)
RBC: 4.34 Mil/uL (ref 3.87–5.11)
RDW: 13.7 % (ref 11.5–15.5)
WBC: 10.9 10*3/uL — ABNORMAL HIGH (ref 4.0–10.5)

## 2016-03-29 LAB — URINALYSIS, ROUTINE W REFLEX MICROSCOPIC
Bilirubin Urine: NEGATIVE
Hgb urine dipstick: NEGATIVE
KETONES UR: 15 — AB
Leukocytes, UA: NEGATIVE
Nitrite: NEGATIVE
PH: 6 (ref 5.0–8.0)
RBC / HPF: NONE SEEN (ref 0–?)
SPECIFIC GRAVITY, URINE: 1.025 (ref 1.000–1.030)
TOTAL PROTEIN, URINE-UPE24: NEGATIVE
URINE GLUCOSE: NEGATIVE
UROBILINOGEN UA: 0.2 (ref 0.0–1.0)

## 2016-03-29 NOTE — Assessment & Plan Note (Signed)
Right thoracic pain: Likely MSK, ROS is benign. Exam benign. Udip trace proteins. Will get a chest x-ray, UA and recommend Tylenol, Flexeril. HTN: Seems to be controlled with metoprolol, Hyzaar and amlodipine. Takes potassium supplements inconsistently. Recommend to take them every day, check a BMP and CBC. Refill medications RTC 6 months

## 2016-05-18 ENCOUNTER — Encounter: Payer: Self-pay | Admitting: Gynecology

## 2016-07-11 ENCOUNTER — Emergency Department (HOSPITAL_BASED_OUTPATIENT_CLINIC_OR_DEPARTMENT_OTHER)
Admission: EM | Admit: 2016-07-11 | Discharge: 2016-07-11 | Disposition: A | Discharge status: Home / Self Care | Payer: 59 | Attending: Emergency Medicine | Admitting: Emergency Medicine

## 2016-07-11 ENCOUNTER — Encounter (HOSPITAL_BASED_OUTPATIENT_CLINIC_OR_DEPARTMENT_OTHER): Payer: Self-pay | Admitting: *Deleted

## 2016-07-11 ENCOUNTER — Emergency Department (HOSPITAL_BASED_OUTPATIENT_CLINIC_OR_DEPARTMENT_OTHER): Payer: 59

## 2016-07-11 ENCOUNTER — Telehealth: Payer: Self-pay | Admitting: Behavioral Health

## 2016-07-11 DIAGNOSIS — M5431 Sciatica, right side: Secondary | ICD-10-CM | POA: Insufficient documentation

## 2016-07-11 DIAGNOSIS — I10 Essential (primary) hypertension: Secondary | ICD-10-CM | POA: Insufficient documentation

## 2016-07-11 DIAGNOSIS — M79661 Pain in right lower leg: Secondary | ICD-10-CM | POA: Diagnosis present

## 2016-07-11 DIAGNOSIS — Z7982 Long term (current) use of aspirin: Secondary | ICD-10-CM | POA: Insufficient documentation

## 2016-07-11 DIAGNOSIS — Z79899 Other long term (current) drug therapy: Secondary | ICD-10-CM | POA: Diagnosis not present

## 2016-07-11 DIAGNOSIS — M7989 Other specified soft tissue disorders: Secondary | ICD-10-CM

## 2016-07-11 MED ORDER — MELOXICAM 15 MG PO TABS: 15.0000 mg | ORAL_TABLET | Freq: Every day | ORAL | 0 refills | Status: DC

## 2016-07-11 MED ORDER — PREDNISONE 20 MG PO TABS
40.0000 mg | ORAL_TABLET | Freq: Every day | ORAL | 0 refills | Status: DC
Start: 2016-07-11 — End: 2016-07-27

## 2016-07-11 MED ORDER — TRAMADOL HCL 50 MG PO TABS: 50.0000 mg | ORAL_TABLET | Freq: Four times a day (QID) | ORAL | 0 refills | Status: DC | PRN

## 2016-07-11 NOTE — Discharge Instructions (Signed)
SEEK IMMEDIATE MEDICAL ATTENTION IF: New numbness, tingling, weakness, or problem with the use of your arms or legs.  Severe back pain not relieved with medications.  Change in bowel or bladder control.  Increasing pain in any areas of the body (such as chest or abdominal pain).  Shortness of breath, dizziness or fainting.  Nausea (feeling sick to your stomach), vomiting, fever, or sweats.      Instrucciones generales Mantenga una buena postura. Evite reclinarse hacia adelante cuando est sentado. Evite encorvar la espalda mientras est de pie. Mantenga un peso saludable. El exceso de peso ejerce presin adicional sobre la espalda y hace que resulte difcil mantener una buena [place]. Use calzado con buen apoyo y cmodo. Evite usar tacones. Evite dormir sobre un colchn que sea demasiado blando o demasiado duro. Un colchn que ofrezca un apoyo suficientemente firme para su espalda al dormir puede ayudar a Engineer, materialsaliviar el dolor. Concurra a todas las visitas de control como se lo haya indicado el mdico. Esto es importante. SOLICITE ATENCIN MDICA SI: El dolor lo despierta cuando est dormido. El dolor empeora cuando se [place]. El dolor es peor del que experiment en el pasado. Los sntomas duran ms de 4 semanas. Pierde peso en forma inexplicable.  SOLICITE ATENCIN MDICA DE INMEDIATO SI: Pierde el control de la vejiga o del intestino (incontinencia). Tiene los siguientes sntomas: Debilidad que empeora en la parte inferior de la espalda, la pelvis, los glteos o las piernas. Enrojecimiento o inflamacin en la espalda. Sensacin de ardor al ConocoPhillipsorinar.

## 2016-07-11 NOTE — Telephone Encounter (Signed)
Patient presented to clinic with complaints of leg pain. RN brought patient back to treatment room for further evaluation. Patient voiced for the past week she's experienced right sided pain from the upper back down to her leg. Currently, the patient rates her pain 9/10 (0-10 pain scale). Also, she reports leg swelling, breathing difficulty without exertion & some dizziness. Patient states that she "can barely stand on her right leg". She denies chest pain & numbness & tingling in limbs. Assessed the site; patient continues to have swelling, but no redness; skin is tight & shiny. The following vitals were obtained: BP 129/80 P 66 O2 98%. No available appointments in the office. Advised patient to be seen at the emergency department here in the building. Patient agreed. Offered patient a wheelchair to transport down to the main level, but she declined. Patient insisted on taking the elevator without any assistive device. She is accompanied by a relative & will take the elevator down to the emergency department. Message routed to PCP for review.

## 2016-07-11 NOTE — ED Notes (Signed)
Patient transported to Ultrasound 

## 2016-07-11 NOTE — ED Triage Notes (Signed)
Pt c/o right lower leg and calf pain /swelling x 1 week

## 2016-07-11 NOTE — ED Notes (Signed)
Pt verbalizes understanding of d/c instructions and denies any further needs at this time. 

## 2016-07-11 NOTE — ED Provider Notes (Signed)
[facility] Provider Note   CSN: [id number] Arrival date & time: 07/11/16  1722  By signing my name below, I, Rosana Fret, attest that this documentation has been prepared under the direction and in the presence of Arthor Captain, PA-C.  Electronically Signed: Rosana Fret, ED Scribe. 07/11/16. 6:50 PM.  History   Chief Complaint Chief Complaint  Patient presents with  . Leg Pain   The history is provided by the patient. No language interpreter was used.   HPI Comments: [name] is a 61 y.o. female with a PMHx of HTN, who presents to the Emergency Department complaining of constant, moderate right lower leg pain and swelling onset 8 days ago. No hx of similar symptoms. No injury. Pt reports associated back pain that radiates down her right leg. Pt is able to walk but it exacerbates pain. No h/o PE/DVT, recent long travel, or prolonged immobilization.  Pt denies numbness/tingling to the area, knee pain, weakness or any other complaints at this time.  Past Medical History:  Diagnosis Date  . Anemia   . Anxiety and depression    admited w/ xanax overdose 6-12, Rx lexapro per psych  . Arthritis   . CVA (cerebral infarction)    per MRI 07-2010  . Headache(784.0)   . Hypertension   . Hypokalemia   . Missed ab   . NSVD (normal spontaneous vaginal delivery)    X3    Patient Active Problem List   Diagnosis Date Noted  . PCP NOTES >>>>>> 03/29/2016  . Depression 06/25/2013  . Sinusitis, acute 12/13/2012  . Annual physical exam 04/06/2012  . CVA (cerebral infarction)   . KNEE PAIN 02/01/2010  . DYSPNEA ON EXERTION 01/19/2010  . Anemia 12/14/2006  . Essential hypertension 12/14/2006  . HEADACHE 12/14/2006    Past Surgical History:  Procedure Laterality Date  . APPENDECTOMY    . BLADDER SURGERY    . BLADDER SUSPENSION     DONE IN [facility]  . CESAREAN SECTION    . ENDOMETRIAL ABLATION     HER OPTION  . TUBAL LIGATION      OB History    Gravida Para Term Preterm AB Living   5 3 3   2 3    SAB TAB Ectopic Multiple Live Births   2       3       Home Medications    Prior to Admission medications   Medication Sig Start Date End Date Taking? Authorizing Provider  amLODipine (NORVASC) 10 MG tablet Take 1 tablet (10 mg total) by mouth daily. 03/28/16   Wanda Plump, MD  aspirin EC 81 MG tablet Take 81 mg by mouth daily.    [provider]  cyclobenzaprine (FLEXERIL) 10 MG tablet Take 1 tablet (10 mg total) by mouth at bedtime as needed for muscle spasms. 03/28/16   Wanda Plump, MD  losartan-hydrochlorothiazide (HYZAAR) 100-25 MG tablet Take 1 tablet by mouth daily. 03/28/16   Wanda Plump, MD  meloxicam (MOBIC) 15 MG tablet Take 1 tablet (15 mg total) by mouth daily. 07/11/16   Arthor Captain, PA-C  metoprolol succinate (TOPROL-XL) 50 MG 24 hr tablet Take 1 tablet (50 mg total) by mouth daily. Take with or immediately following a meal. 03/28/16   Wanda Plump, MD  potassium chloride SA (K-DUR,KLOR-CON) 20 MEQ tablet Take 1 tablet (20 mEq total) by mouth daily. 03/28/16   Wanda Plump, MD  predniSONE (DELTASONE) 20 MG tablet Take 2  tablets (40 mg total) by mouth daily. 07/11/16   Arthor Captain, PA-C  traMADol (ULTRAM) 50 MG tablet Take 1 tablet (50 mg total) by mouth every 6 (six) hours as needed. 07/11/16   Arthor Captain, PA-C    Family History Family History  Problem Relation Age of Onset  . Heart attack Father 50  . Diabetes Unknown        aunt  . Hypertension Unknown        many family members  . Breast cancer Unknown        distant relative   . Colon cancer Neg Hx     Social History Social History  Substance Use Topics  . Smoking status: Never Smoker  . Smokeless tobacco: Never Used  . Alcohol use No     Allergies   Lisinopril   Review of Systems Review of Systems  Cardiovascular: Positive for leg swelling.  Musculoskeletal: Positive for back pain and myalgias.  Neurological: Negative for weakness and  numbness.     Physical Exam Updated Vital Signs BP 135/85   Pulse 68   Temp 98.4 F (36.9 C)   Resp 18   Ht 5\' 4"  (1.626 m)   Wt 72.1 kg (159 lb)   LMP 03/25/2013   SpO2 99%   BMI 27.29 kg/m   Physical Exam  Constitutional: She is oriented to person, place, and time. She appears well-developed and well-nourished.  HENT:  Head: Normocephalic.  Eyes: EOM are normal.  Neck: Normal range of motion.  Pulmonary/Chest: Effort normal.  Abdominal: She exhibits no distension.  Musculoskeletal: Normal range of motion. She exhibits edema.  Positive straight leg test on the left. Normal strength, normal DTRs. Minimal swelling on the right. No pitting edema.   Neurological: She is alert and oriented to person, place, and time. She displays normal reflexes.  Psychiatric: She has a normal mood and affect.  Nursing note and vitals reviewed.    ED Treatments / Results  DIAGNOSTIC STUDIES: Oxygen Saturation is 99% on RA, normal by my interpretation.   COORDINATION OF CARE: 6:22 PM-Discussed next steps with pt including a [place]. Pt verbalized understanding and is agreeable with the plan.   Labs (all labs ordered are listed, but only abnormal results are displayed) Labs Reviewed - No data to display  EKG  EKG Interpretation None       Radiology No results found.  Procedures Procedures (including critical care time)  Medications Ordered in ED Medications - No data to display   Initial Impression / Assessment and Plan / ED Course  I have reviewed the triage vital signs and the nursing notes.  Pertinent labs & imaging results that were available during my care of the patient were reviewed by me and considered in my medical decision making (see chart for details).     I personally performed the services described in this documentation, which was scribed in my presence. The recorded information has been reviewed and is accurate.   Patient with back pain.  No neurological  deficits and normal neuro exam.  Patient can walk but states is painful.  No loss of bowel or bladder control.  No concern for cauda equina.  No fever, night sweats, weight loss, h/o cancer, IVDU.  RICE protocol and pain medicine indicated and discussed with patient.      Final Clinical Impressions(s) / ED Diagnoses   Final diagnoses:  Sciatica of right side    New Prescriptions Discharge Medication List as of 07/11/2016  8:05 PM    START taking these medications   Details  meloxicam (MOBIC) 15 MG tablet Take 1 tablet (15 mg total) by mouth daily., Starting Mon 07/11/2016, Print    predniSONE (DELTASONE) 20 MG tablet Take 2 tablets (40 mg total) by mouth daily., Starting Mon 07/11/2016, Print    traMADol (ULTRAM) 50 MG tablet Take 1 tablet (50 mg total) by mouth every 6 (six) hours as needed., Starting Mon 07/11/2016, Print            Rhetta Cleek, CelebrationAbigail, PA-C 07/16/16 0011    Rolan BuccoBelfi, Melanie, MD 07/16/16 [phone]

## 2016-07-12 ENCOUNTER — Ambulatory Visit: Payer: 59 | Admitting: Internal Medicine

## 2016-07-12 DIAGNOSIS — Z0289 Encounter for other administrative examinations: Secondary | ICD-10-CM

## 2016-07-12 NOTE — Telephone Encounter (Signed)
Noted, thx.

## 2016-07-25 ENCOUNTER — Telehealth: Payer: Self-pay | Admitting: Medical

## 2016-07-25 ENCOUNTER — Encounter: Payer: Self-pay | Admitting: Medical

## 2016-07-25 ENCOUNTER — Other Ambulatory Visit: Payer: Self-pay | Admitting: Medical

## 2016-07-25 ENCOUNTER — Ambulatory Visit (INDEPENDENT_AMBULATORY_CARE_PROVIDER_SITE_OTHER): Payer: 59 | Admitting: Medical

## 2016-07-25 ENCOUNTER — Ambulatory Visit (HOSPITAL_BASED_OUTPATIENT_CLINIC_OR_DEPARTMENT_OTHER)
Admission: RE | Admit: 2016-07-25 | Discharge: 2016-07-25 | Disposition: A | Discharge status: Home / Self Care | Payer: 59 | Source: Ambulatory Visit | Attending: Medical | Admitting: Medical

## 2016-07-25 VITALS — BP 131/77 | HR 55 | Temp 98.0°F | Resp 14 | Ht 60.0 in | Wt 164.4 lb

## 2016-07-25 DIAGNOSIS — M5431 Sciatica, right side: Secondary | ICD-10-CM

## 2016-07-25 DIAGNOSIS — M4316 Spondylolisthesis, lumbar region: Secondary | ICD-10-CM | POA: Insufficient documentation

## 2016-07-25 DIAGNOSIS — M25551 Pain in right hip: Secondary | ICD-10-CM

## 2016-07-25 DIAGNOSIS — M47817 Spondylosis without myelopathy or radiculopathy, lumbosacral region: Secondary | ICD-10-CM | POA: Diagnosis not present

## 2016-07-25 DIAGNOSIS — M160 Bilateral primary osteoarthritis of hip: Secondary | ICD-10-CM | POA: Insufficient documentation

## 2016-07-25 DIAGNOSIS — M5441 Lumbago with sciatica, right side: Secondary | ICD-10-CM | POA: Diagnosis not present

## 2016-07-25 MED ORDER — HYDROCODONE-ACETAMINOPHEN 5-325 MG PO TABS: 1.0000 | ORAL_TABLET | Freq: Four times a day (QID) | ORAL | 0 refills | Status: DC | PRN

## 2016-07-25 MED ORDER — GABAPENTIN 100 MG PO CAPS: 100.0000 mg | ORAL_CAPSULE | Freq: Three times a day (TID) | ORAL | 0 refills | Status: DC

## 2016-07-25 MED ORDER — KETOROLAC TROMETHAMINE 60 MG/2ML IM SOLN
60.0000 mg | Freq: Once | INTRAMUSCULAR | Status: AC
  Administered 2016-07-25: 60 mg via INTRAMUSCULAR

## 2016-07-25 NOTE — Progress Notes (Signed)
Subjective:    Patient ID: [name], female    DOB: 03-04-1955, 60 y.o.   MRN: [id number]  HPI   Pt in for evaluation.   Pt Pt states pain is for 2-3 weeks ago. Pt states pain is severe. Occasional relief brief for minutes but pain will come back quickly. Pt states leg feels numb at times. Leg feels swollen as well at times. Also feels cold at times. Pt never had cramps in her feet at times walking short distance.  Pt seen in the ED 2 weeks ago and she had negative ultrasound of left lower ext.  Distribution of the pain is back, rt si area and down her rt leg.  Pt needs work note. She stands on her feet all day and pain is not bearable.  Pt reports no leg weakness, no foot drop, no saddle anesthesia or urinary incontinence.     Review of Systems  Constitutional: Negative for chills, fatigue and fever.  Respiratory: Negative for cough, chest tightness, shortness of breath and wheezing.   Cardiovascular: Negative for chest pain and palpitations.  Gastrointestinal: Negative for abdominal pain.  Genitourinary: Negative for difficulty urinating, dysuria, hematuria and urgency.  Musculoskeletal: Positive for back pain. Negative for myalgias and neck stiffness.       Rt hip pain  Skin: Negative for rash.  Neurological:       Radicular pain.  Hematological: Negative for adenopathy. Does not bruise/bleed easily.  Psychiatric/Behavioral: Negative for behavioral problems and confusion.   Past Medical History:  Diagnosis Date  . Anemia   . Anxiety and depression    admited w/ xanax overdose 6-12, Rx lexapro per psych  . Arthritis   . CVA (cerebral infarction)    per MRI 07-2010  . Headache(784.0)   . Hypertension   . Hypokalemia   . Missed ab   . NSVD (normal spontaneous vaginal delivery)    X3     Social History   Social History  . Marital status: Married    Spouse name: N/A  . Number of children: 3  . Years of education: N/A   Occupational History  . works,  Marine scientist    Social History Main Topics  . Smoking status: Never Smoker  . Smokeless tobacco: Never Used  . Alcohol use No  . Drug use: No  . Sexual activity: Yes    Birth control/ protection: Surgical   Other Topics Concern  . Not on file   Social History Narrative   From [place]   5 g-kids     Past Surgical History:  Procedure Laterality Date  . APPENDECTOMY    . BLADDER SURGERY    . BLADDER SUSPENSION     DONE IN [facility]  . CESAREAN SECTION    . ENDOMETRIAL ABLATION     HER OPTION  . TUBAL LIGATION      Family History  Problem Relation Age of Onset  . Heart attack Father 61  . Diabetes Unknown        aunt  . Hypertension Unknown        many family members  . Breast cancer Unknown        distant relative   . Colon cancer Neg Hx     Allergies  Allergen Reactions  . Lisinopril     REACTION: cough    Current Outpatient Prescriptions on File Prior to Visit  Medication Sig Dispense Refill  . amLODipine (NORVASC) 10 MG tablet Take 1 tablet (10  mg total) by mouth daily. 30 tablet 5  . aspirin EC 81 MG tablet Take 81 mg by mouth daily.    . cyclobenzaprine (FLEXERIL) 10 MG tablet Take 1 tablet (10 mg total) by mouth at bedtime as needed for muscle spasms. 21 tablet 0  . losartan-hydrochlorothiazide (HYZAAR) 100-25 MG tablet Take 1 tablet by mouth daily. 30 tablet 5  . meloxicam (MOBIC) 15 MG tablet Take 1 tablet (15 mg total) by mouth daily. 20 tablet 0  . metoprolol succinate (TOPROL-XL) 50 MG 24 hr tablet Take 1 tablet (50 mg total) by mouth daily. Take with or immediately following a meal. 30 tablet 5  . potassium chloride SA (K-DUR,KLOR-CON) 20 MEQ tablet Take 1 tablet (20 mEq total) by mouth daily. 30 tablet 5  . predniSONE (DELTASONE) 20 MG tablet Take 2 tablets (40 mg total) by mouth daily. 10 tablet 0   No current facility-administered medications on file prior to visit.     BP 131/77   Pulse (!) 55   Temp 98 F (36.7 C) (Oral)   Resp  14   Ht 5' (1.524 m)   Wt 164 lb 6.4 oz (74.6 kg)   LMP 03/25/2013   SpO2 99%   BMI 32.11 kg/m       Objective:   Physical Exam  General Appearance- Not in acute distress.    Chest and Lung Exam Auscultation: Breath sounds:-Normal. Clear even and unlabored. Adventitious sounds:- No Adventitious sounds.  Cardiovascular Auscultation:Rythm - Regular, rate and rythm. Heart Sounds -Normal heart sounds.  Abdomen Inspection:-Inspection Normal.  Palpation/Perucssion: Palpation and Percussion of the abdomen reveal- Non Tender, No Rebound tenderness, No rigidity(Guarding) and No Palpable abdominal masses.  Liver:-Normal.  Spleen:- Normal.   Back Mid lumbar spine tenderness to palpation. And rt si tenderness. Pain on straight leg lift. Pain on lateral movements and flexion/extension of the spine.  Lower ext neurologic  L5-S1 sensation intact bilaterally. Normal patellar reflexes bilaterally. No foot drop bilaterally.  Lower ext- calfs are symmetric, no obvious pedal edema. Negative homans sign. On rt side testing reports lateral calf slight tender. No polpiteal pain(note US negative 2 weeks ago)  Rt hip- mild pain on range of motion.      Assessment & Plan:  For your back region pain, sciatica and hip region pain will get xrays.  Since you do have radiating pain all the way down your leg I am considering that you may need mri of your lower back.  You got toradol 60 mg im today. You can restart meloxicam tomorrow.  Will rx gabapantin to use at night.  Stop tramadol. Will rx norco for more severe pain.  Will write work note to return on this coming Monday. Since you pain is severe and cause not determined yet I do think you would benefit from fmla form to be filled out.  I think based on duration of pain will go ahead and refer you to sports medicine. Will try to get you appointment this week.  Follow up with me next week if sport med appointment delayed.     Andree Heeg, Ramon DredgeEdward, PA-C

## 2016-07-25 NOTE — Telephone Encounter (Signed)
Can we get pt in with sports med by this coming thursday

## 2016-07-25 NOTE — Patient Instructions (Addendum)
For your back region pain, sciatica and hip region pain will get xrays.  Since you do have radiating pain all the way down your leg I am considering that you may need mri of your lower back.  You got toradol 60 mg im today. You can restart meloxicam tomorrow.  Will rx gabapantin to use at night.  Stop tramadol. Will rx norco for more severe pain.  Will write work note to return on this coming Monday. Since you pain is severe and cause not determined yet I do think you would benefit from fmla form to be filled out.  I think based on duration of pain will go ahead and refer you to sports medicine. Will try to get you appointment this week.  Follow up with me next week if sport med appointment delayed.

## 2016-07-26 ENCOUNTER — Telehealth: Payer: Self-pay | Admitting: Medical

## 2016-07-26 ENCOUNTER — Telehealth: Payer: Self-pay | Admitting: Internal Medicine

## 2016-07-26 NOTE — Telephone Encounter (Signed)
Pt was called and informed the below and pt understood, pt indicated felt better with injection but feeling area still with small pain (not like the pain she had before) in the area of leg and hip, pt stated also that meds is making her feel better but is feeling like tired and sluggish (the effect of meds). Pt mentioned already has an appt with sport med for tomorrow.

## 2016-07-26 NOTE — Telephone Encounter (Signed)
error 

## 2016-07-26 NOTE — Telephone Encounter (Signed)
-----   Message from Esperanza RichtersEdward Saguier, PA-C sent at 07/25/2016  8:47 PM EDT ----- Pt has moderate degenerative changes to lower lumbar spine. Heights of vertebrae well maintained. Referral to sports medicine pending. Did toradol(med we gave injection) help with her pain?

## 2016-07-27 ENCOUNTER — Encounter: Payer: Self-pay | Admitting: Family Medicine

## 2016-07-27 ENCOUNTER — Ambulatory Visit (INDEPENDENT_AMBULATORY_CARE_PROVIDER_SITE_OTHER): Payer: 59 | Admitting: Family Medicine

## 2016-07-27 ENCOUNTER — Telehealth: Payer: Self-pay | Admitting: Medical

## 2016-07-27 DIAGNOSIS — M5416 Radiculopathy, lumbar region: Secondary | ICD-10-CM | POA: Diagnosis not present

## 2016-07-27 MED ORDER — TIZANIDINE HCL 4 MG PO TABS: 4.0000 mg | ORAL_TABLET | Freq: Three times a day (TID) | ORAL | 1 refills | Status: DC | PRN

## 2016-07-27 NOTE — Patient Instructions (Signed)
You have lumbar radiculopathy (a pinched nerve in your low back) and sacroiliitis. Take tylenol for baseline pain relief (1-2 extra strength tabs 3x/day) Continue meloxicam daily with food for pain and inflammation. Tizanidine as needed for muscle spasms (no driving on this medicine if it makes you sleepy). Continue the gabapentin. I would stop all other medicines for your back. Stay as active as possible. Physical therapy has been shown to be helpful. Strengthening of low back muscles, abdominal musculature are key for long term pain relief. Check with insurance about physical therapy versus MRI - either way is ok - typically we prefer the physical therapy if it's not too costly Follow up with me in 1 month.

## 2016-07-27 NOTE — Telephone Encounter (Signed)
Reviewed [name] note today and will wait for sports med note as well to review.

## 2016-07-28 DIAGNOSIS — M5416 Radiculopathy, lumbar region: Secondary | ICD-10-CM | POA: Insufficient documentation

## 2016-07-28 NOTE — Progress Notes (Signed)
PCP: Wanda PlumpPaz, Jose E, MD Consultation requested by Esperanza RichtersEdward Saguier PA-C  Subjective:   HPI: Patient is a 61 y.o. female here for right sided back, hip pain.  Patient reports for about 1 month she has had a month of right sided low back pain radiating down leg to foot. Associated cold feeling and numbness. Leg feels like it is cramped up. Has tried toradol IM, prednisone, norco, meloxicam, gabapentin. Pain is 7/10 level and sharp. No bowel/bladder dysfunction. Has been limping. No skin changes.  Past Medical History:  Diagnosis Date  . Anemia   . Anxiety and depression    admited w/ xanax overdose 6-12, Rx lexapro per psych  . Arthritis   . CVA (cerebral infarction)    per MRI 07-2010  . Headache(784.0)   . Hypertension   . Hypokalemia   . Missed ab   . NSVD (normal spontaneous vaginal delivery)    X3    Current Outpatient Prescriptions on File Prior to Visit  Medication Sig Dispense Refill  . amLODipine (NORVASC) 10 MG tablet Take 1 tablet (10 mg total) by mouth daily. 30 tablet 5  . aspirin EC 81 MG tablet Take 81 mg by mouth daily.    Marland Kitchen. gabapentin (NEURONTIN) 100 MG capsule Take 1 capsule (100 mg total) by mouth 3 (three) times daily. 30 capsule 0  . losartan-hydrochlorothiazide (HYZAAR) 100-25 MG tablet Take 1 tablet by mouth daily. 30 tablet 5  . meloxicam (MOBIC) 15 MG tablet Take 1 tablet (15 mg total) by mouth daily. 20 tablet 0  . metoprolol succinate (TOPROL-XL) 50 MG 24 hr tablet Take 1 tablet (50 mg total) by mouth daily. Take with or immediately following a meal. 30 tablet 5  . potassium chloride SA (K-DUR,KLOR-CON) 20 MEQ tablet Take 1 tablet (20 mEq total) by mouth daily. 30 tablet 5   No current facility-administered medications on file prior to visit.     Past Surgical History:  Procedure Laterality Date  . APPENDECTOMY    . BLADDER SURGERY    . [facility]USP[facility]     [facility]  . CESAREAN SECTION    . ENDOMETRIAL ABLATION     HER OPTION  .  TUBAL LIGATION      Allergies  Allergen Reactions  . Lisinopril     REACTION: cough    Social History   Social History  . Marital status: Married    Spouse name: N/A  . Number of children: 3  . Years of education: N/A   Occupational History  . works, [employer] scientist[employer]    Social History Main Topics  . Smoking status: Never Smoker  . Smokeless tobacco: Never Used  . Alcohol use No  . Drug use: No  . Sexual activity: Yes    Birth control/ protection: Surgical   Other Topics Concern  . Not on file   Social History Narrative   From [place]   5 g-kids     Family History  Problem Relation Age of Onset  . Heart attack Father 9370  . Diabetes Unknown        aunt  . Hypertension Unknown        many family members  . Breast cancer Unknown        distant relative   . Colon cancer Neg Hx     BP (!) 150/88   Pulse 63   Ht 5\' 5"  (1.651 m)   Wt 165 lb (74.8 kg)   LMP 03/25/2013  BMI 27.46 kg/m   Review of Systems: See HPI above.     Objective:  Physical Exam:  Gen: NAD, comfortable in exam room  Back/right hip: No gross deformity, scoliosis. TTP right lumbar paraspinal region.  No midline or bony TTP. FROM with pain on flexion. Strength LEs 5/5 all muscle groups.   2+ MSRs in patellar and achilles tendons, equal bilaterally. Positive right SLR. Sensation intact to light touch bilaterally. Negative logroll bilateral hips Positive right fabers with mod decreased motion.  Negative left.  Negative piriformis stretches.   Assessment & Plan:  1. Lumbar radiculopathy - she has tried prednisone, meloxicam.  She will continue meloxicam and gabepentin, take tizanidine as needed.  Stop other medications.  We discussed physical therapy and MRI - she would like to check with insurance regarding cost of these and contact us on how she would like to proceed (encouraged PT).  F/u in 1 month otherwise.

## 2016-07-28 NOTE — Assessment & Plan Note (Signed)
she has tried prednisone, meloxicam.  She will continue meloxicam and gabepentin, take tizanidine as needed.  Stop other medications.  We discussed physical therapy and MRI - she would like to check with insurance regarding cost of these and contact us on how she would like to proceed (encouraged PT).  F/u in 1 month otherwise.

## 2016-08-25 ENCOUNTER — Ambulatory Visit: Payer: 59 | Admitting: Family Medicine

## 2016-09-27 ENCOUNTER — Other Ambulatory Visit: Payer: Self-pay | Admitting: Medical

## 2016-09-30 ENCOUNTER — Ambulatory Visit: Payer: 59 | Admitting: Internal Medicine

## 2016-10-10 ENCOUNTER — Encounter: Payer: Self-pay | Admitting: Internal Medicine

## 2016-10-10 ENCOUNTER — Ambulatory Visit (INDEPENDENT_AMBULATORY_CARE_PROVIDER_SITE_OTHER): Payer: 59 | Admitting: Internal Medicine

## 2016-10-10 VITALS — BP 132/84 | HR 53 | Temp 98.2°F | Resp 14 | Ht 65.0 in | Wt 161.4 lb

## 2016-10-10 DIAGNOSIS — M5441 Lumbago with sciatica, right side: Secondary | ICD-10-CM

## 2016-10-10 DIAGNOSIS — I10 Essential (primary) hypertension: Secondary | ICD-10-CM

## 2016-10-10 MED ORDER — HYDROCODONE-ACETAMINOPHEN 5-325 MG PO TABS: 1.0000 | ORAL_TABLET | Freq: Every evening | ORAL | 0 refills | Status: DC | PRN

## 2016-10-10 MED ORDER — POTASSIUM CHLORIDE CRYS ER 20 MEQ PO TBCR: 20.0000 meq | EXTENDED_RELEASE_TABLET | Freq: Every day | ORAL | 6 refills | Status: DC

## 2016-10-10 MED ORDER — LOSARTAN POTASSIUM-HCTZ 100-25 MG PO TABS: 1.0000 | ORAL_TABLET | Freq: Every day | ORAL | 6 refills | Status: DC

## 2016-10-10 MED ORDER — PREDNISONE 10 MG PO TABS: ORAL_TABLET | ORAL | 0 refills | Status: DC

## 2016-10-10 MED ORDER — NAPROXEN 500 MG PO TABS: 500.0000 mg | ORAL_TABLET | Freq: Every day | ORAL | 0 refills | Status: DC | PRN

## 2016-10-10 MED ORDER — METOPROLOL SUCCINATE ER 50 MG PO TB24: 50.0000 mg | ORAL_TABLET | Freq: Every day | ORAL | 6 refills | Status: AC

## 2016-10-10 MED ORDER — AMLODIPINE BESYLATE 10 MG PO TABS: 10.0000 mg | ORAL_TABLET | Freq: Every day | ORAL | 6 refills | Status: AC

## 2016-10-10 NOTE — Progress Notes (Signed)
Subjective:    Patient ID: [name], female    DOB: [birth year], 61 y.o.   MRN: [id number]  DOS:  10/10/2016 Type of visit - description : rov Interval history: She is here for a routine visit however her chief complaint is back pain. Symptoms started approximately 07/03/2016, pain is located at the right lower back, radiates down to the right leg up to the foot. Initially symptoms were moderate,  but now they are steady, not necessarily worse with walking, (+) on and off exacerbation associated w/ numbness and  lower extremity numbness and cramping.   Chart is reviewed: Went to the ER 07/11/2016, she had some leg swelling, ultrasound was negative for DVT. Was prescribed meloxicam , prednisone. Came to this office 07/22/2016, prescribed gabapentin, hydrocodone ketorolac. Saw sports med 07/27/2016, prescribed physical therapy and they were considering a MRI Then she lost follow-up, has been suffering pain since. Was visiting a family member in [place] and went to the ER 2 days ago with severe pain. Was given an IM shot and then naproxen by mouth. Feels like naproxen does help when she takes it.   Review of Systems Denies any back injury. No fever or chills Had initially right lower extremity edema but that is largely resolved. Taking naproxen for the last few days, no nausea, vomiting or abdominal pain. No bladder or bowel incontinence  Past Medical History:  Diagnosis Date  . Anemia   . Anxiety and depression    admited w/ xanax overdose 6-12, Rx lexapro per psych  . Arthritis   . CVA (cerebral infarction)    per MRI 07-2010  . Headache(784.0)   . Hypertension   . Hypokalemia   . Missed ab   . NSVD (normal spontaneous vaginal delivery)    X3    Past Surgical History:  Procedure Laterality Date  . APPENDECTOMY    . BLADDER SURGERY    . BLADDER SUSPENSION     DONE IN [facility]  . CESAREAN SECTION    . ENDOMETRIAL ABLATION     HER OPTION  . TUBAL LIGATION       Social History   Social History  . Marital status: Married    Spouse name: N/A  . Number of children: 3  . Years of education: N/A   Occupational History  . works, Marine scientist    Social History Main Topics  . Smoking status: Never Smoker  . Smokeless tobacco: Never Used  . Alcohol use No  . Drug use: No  . Sexual activity: Yes    Birth control/ protection: Surgical   Other Topics Concern  . Not on file   Social History Narrative   From [place]   5 g-kids       Allergies as of 10/10/2016      Reactions   Lisinopril    REACTION: cough      Medication List       Accurate as of 10/10/16 11:59 PM. Always use your most recent med list.          amLODipine 10 MG tablet Commonly known as:  NORVASC Take 1 tablet (10 mg total) by mouth daily.   aspirin EC 81 MG tablet Take 81 mg by mouth daily.   HYDROcodone-acetaminophen 5-325 MG tablet Commonly known as:  NORCO/VICODIN Take 1-2 tablets by mouth at bedtime as needed.   losartan-hydrochlorothiazide 100-25 MG tablet Commonly known as:  HYZAAR Take 1 tablet by mouth daily.   meloxicam 15 MG  tablet Commonly known as:  MOBIC Take 1 tablet (15 mg total) by mouth daily.   metoprolol succinate 50 MG 24 hr tablet Commonly known as:  TOPROL-XL Take 1 tablet (50 mg total) by mouth daily. Take with or immediately following a meal.   naproxen 500 MG tablet Commonly known as:  NAPROSYN Take 1 tablet (500 mg total) by mouth daily as needed.   potassium chloride SA 20 MEQ tablet Commonly known as:  K-DUR,KLOR-CON Take 1 tablet (20 mEq total) by mouth daily.   predniSONE 10 MG tablet Commonly known as:  DELTASONE 4 tablets x 2 days, 3 tabs x 2 days, 2 tabs x 2 days, 1 tab x 2 days          Objective:   Physical Exam BP 132/84 (BP Location: Right Arm, Patient Position: Sitting, Cuff Size: Small)   Pulse (!) 53   Temp 98.2 F (36.8 C) (Oral)   Resp 14   Ht  (1.651 m)   Wt 161 lb 6 oz (73.2  kg)   LMP 03/25/2013   SpO2 98%   BMI 26.85 kg/m  General:   Well developed, well nourished . NAD.  HEENT:  Normocephalic . Face symmetric, atraumatic Lungs:  CTA B Normal respiratory effort, no intercostal retractions, no accessory muscle use. Heart: RRR,  no murmur.  No pretibial edema bilaterally  Skin: Not pale. Not jaundice Neurologic:  alert & oriented X3.  Speech normal, antalgic due to pain. Motor symmetric, DTRs symmetric. Question of (+) straight leg test on the R Psych--  Cognition and judgment appear intact.  Cooperative with normal attention span and concentration.  Behavior appropriate. No anxious or depressed appearing.      Assessment & Plan:  Assessment HTN Hyperlipidemia Anxiety depression: Xanax overdose 06/2010, used to see psychiatry CVA per MRI 07-2010 h/o  anemia, referred to GI 2016  Plan: HTN: Continue amlodipine, Hyzaar, Toprol and potassium. Check a BMP and CBC Back pain with radicular features: Was previously seen by sports medicine and they recommended MRI, I agree that further investigation is needed. Patient would like to see a orthopedic doctor. Plan: Ortho referral prednisone by mouth Stop tizanidine  and gabapentin: No clear if they are helping. Continue naproxen, minimize use, one a day with food. Hydrocodone at bedtime. RTC 3 months   .

## 2016-10-10 NOTE — Progress Notes (Signed)
Pre visit review using our clinic review tool, if applicable. No additional management support is needed unless otherwise documented below in the visit note. 

## 2016-10-10 NOTE — Patient Instructions (Addendum)
GO TO THE LAB : Get the blood work     GO TO THE FRONT DESK Schedule your next appointment for a  checkup in 3 months   TOME PREDNISONA   PARA EL DOLOR:  NAPROXENO UNA VEZ AL DIA CON LAS COMIDAS, DEJE DE TOMARLO SI TIENE DOLOR DE ESTOMAGO O CAMBIO DE COLOR EN LAS HECES  HYDROCODONA, SOLO EN LA NOCHE, UNA O DOS PASTILLAS, DA SUEN~O

## 2016-10-11 LAB — BASIC METABOLIC PANEL
BUN: 26 mg/dL — AB (ref 6–23)
CHLORIDE: 99 meq/L (ref 96–112)
CO2: 33 meq/L — AB (ref 19–32)
CREATININE: 0.8 mg/dL (ref 0.40–1.20)
Calcium: 9.2 mg/dL (ref 8.4–10.5)
GFR: 77.96 mL/min (ref >60.00)
Glucose, Bld: 78 mg/dL (ref 70–99)
Potassium: 3.6 mEq/L (ref 3.5–5.1)
Sodium: 139 mEq/L (ref 135–145)

## 2016-10-11 LAB — CBC WITH DIFFERENTIAL/PLATELET
BASOS PCT: 1 % (ref 0.0–3.0)
Basophils Absolute: 0.1 10*3/uL (ref 0.0–0.1)
EOS ABS: 0.3 10*3/uL (ref 0.0–0.7)
Eosinophils Relative: 3.5 % (ref 0.0–5.0)
HEMATOCRIT: 39.5 % (ref 36.0–46.0)
Hemoglobin: 12.8 g/dL (ref 12.0–15.0)
Lymphocytes Relative: 25.8 % (ref 12.0–46.0)
Lymphs Abs: 2.1 10*3/uL (ref 0.7–4.0)
MCHC: 32.4 g/dL (ref 30.0–36.0)
MCV: 88.3 fl (ref 78.0–100.0)
MONO ABS: 1 10*3/uL (ref 0.1–1.0)
Monocytes Relative: 12.8 % — ABNORMAL HIGH (ref 3.0–12.0)
NEUTROS ABS: 4.6 10*3/uL (ref 1.4–7.7)
Neutrophils Relative %: 56.9 % (ref 43.0–77.0)
PLATELETS: 238 10*3/uL (ref 150.0–400.0)
RBC: 4.48 Mil/uL (ref 3.87–5.11)
RDW: 14.5 % (ref 11.5–15.5)
WBC: 8.1 10*3/uL (ref 4.0–10.5)

## 2016-10-11 NOTE — Assessment & Plan Note (Signed)
HTN: Continue amlodipine, Hyzaar, Toprol and potassium. Check a BMP and CBC Back pain with radicular features: Was previously seen by sports medicine and they recommended MRI, I agree that further investigation is needed. Patient would like to see a orthopedic doctor. Plan: Ortho referral prednisone by mouth Stop tizanidine  and gabapentin: No clear if they are helping. Continue naproxen, minimize use, one a day with food. Hydrocodone at bedtime. RTC 3 months   .

## 2016-10-21 DIAGNOSIS — I151 Hypertension secondary to other renal disorders: Secondary | ICD-10-CM | POA: Insufficient documentation

## 2016-10-28 ENCOUNTER — Inpatient Hospital Stay: Payer: 59 | Admitting: Internal Medicine

## 2016-10-28 DIAGNOSIS — Z0289 Encounter for other administrative examinations: Secondary | ICD-10-CM

## 2016-10-31 ENCOUNTER — Telehealth: Payer: Self-pay

## 2016-10-31 NOTE — Telephone Encounter (Signed)
Robin PihJackie- Pt missed hosp follow-up on 10/28/2016- it is important that we see her and recheck her potassium levels [facility] Please try calling Pt and rescheduling (will need 30 minute appt).

## 2016-11-01 NOTE — Telephone Encounter (Signed)
LVM for pt to call the office and to schedule an appt.

## 2016-12-14 ENCOUNTER — Telehealth: Payer: Self-pay

## 2016-12-14 NOTE — Telephone Encounter (Signed)
Letter printed and mailed to Pt.  

## 2016-12-14 NOTE — Telephone Encounter (Signed)
FYI. Please advise.

## 2016-12-14 NOTE — Telephone Encounter (Signed)
Copied from CRM [phone]. Topic: General - Other >> Dec 13, 2016  3:09 PM Oneal GroutSebastian, Jennifer S wrote: [facility] has stopped answering her phones. FYI

## 2016-12-14 NOTE — Telephone Encounter (Signed)
Send her a letter, needs a follow-up at this office

## 2016-12-19 ENCOUNTER — Encounter: Payer: Self-pay | Admitting: Family

## 2016-12-19 ENCOUNTER — Ambulatory Visit: Payer: 59 | Admitting: Family

## 2016-12-19 VITALS — BP 147/89 | HR 104 | Temp 101.7°F | Resp 16 | Ht 65.0 in | Wt 158.0 lb

## 2016-12-19 DIAGNOSIS — M5430 Sciatica, unspecified side: Secondary | ICD-10-CM | POA: Diagnosis not present

## 2016-12-19 DIAGNOSIS — J020 Streptococcal pharyngitis: Secondary | ICD-10-CM | POA: Diagnosis not present

## 2016-12-19 DIAGNOSIS — R509 Fever, unspecified: Secondary | ICD-10-CM

## 2016-12-19 DIAGNOSIS — E876 Hypokalemia: Secondary | ICD-10-CM

## 2016-12-19 DIAGNOSIS — J029 Acute pharyngitis, unspecified: Secondary | ICD-10-CM

## 2016-12-19 LAB — POCT RAPID STREP A (OFFICE): RAPID STREP A SCREEN: POSITIVE — AB

## 2016-12-19 LAB — POC INFLUENZA A&B (BINAX/QUICKVUE): INFLUENZA A, POC: NEGATIVE

## 2016-12-19 MED ORDER — AMOXICILLIN 500 MG PO CAPS: 500.0000 mg | ORAL_CAPSULE | Freq: Three times a day (TID) | ORAL | 0 refills | Status: DC

## 2016-12-19 MED ORDER — PREDNISONE 10 MG PO TABS: ORAL_TABLET | ORAL | 0 refills | Status: DC

## 2016-12-19 MED ORDER — LIDOCAINE VISCOUS 2 % MT SOLN: OROMUCOSAL | 0 refills | Status: DC

## 2016-12-19 NOTE — Progress Notes (Signed)
Subjective:    Patient ID: [name], female    DOB: 07/09/1955, 61 y.o.   MRN: [id number]  HPI  Pt is a 61 yr old female who presents today with chief complaint of sore throat.  Reports sore throat began yesterday and is associated with chills/fever.  Symptom is described as moderate/severe. Reports trouble eating/drinking due to the pain.   She also reports c/o left leg pain. Has been present x 2 months. Has hx of right sided sciatica 1 month ago which was treated per husband with epidural steroid injection.  He reports that this resolved. Pain is similar and is now on the left side.   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 She had an apparent accidental drug overdose (benzo) prior to  this admission.    Review of Systems See HPI  Past Medical History:  Diagnosis Date  . Anemia   . Anxiety and depression    admited w/ xanax overdose 6-12, Rx lexapro per psych  . Arthritis   . CVA (cerebral infarction)    per MRI 07-2010  . Headache(784.0)   . Hypertension   . Hypokalemia   . NSVD (normal spontaneous vaginal delivery)    X3  . Sciatica    receiving R S1 selective nerve blocks from neurosurgery     Social History   Socioeconomic History  . Marital status: Married    Spouse name: Not on file  . Number of children: 3  . Years of education: Not on file  . Highest education level: Not on file  Social Needs  . Financial resource strain: Not on file  . Food insecurity - worry: Not on file  . Food insecurity - inability: Not on file  . Transportation needs - medical: Not on file  . Transportation needs - non-medical: Not on file  Occupational History  . Occupation: works, [employer]  Tobacco Use  . Smoking status: Never Smoker  . Smokeless tobacco: Never Used  Substance and Sexual Activity  . Alcohol use: No  . Drug use: No  . Sexual activity: Yes    Birth control/protection: Surgical  Other Topics Concern  . Not on  file  Social History Narrative   From [place]   5 g-kids     Past Surgical History:  Procedure Laterality Date  . APPENDECTOMY    . BLADDER SURGERY    . BLADDER SUSPENSION     [facility]  . CESAREAN SECTION    . ENDOMETRIAL ABLATION     HER OPTION  . TUBAL LIGATION      Family History  Problem Relation Age of Onset  . Heart attack Father 4570  . Diabetes Unknown        aunt  . Hypertension Unknown        many family members  . Breast cancer Unknown        distant relative   . Colon cancer Neg Hx     Allergies  Allergen Reactions  . Lisinopril     REACTION: cough    Current Outpatient Medications on File Prior to Visit  Medication Sig Dispense Refill  . amLODipine (NORVASC) 10 MG tablet Take 1 tablet (10 mg total) by mouth daily. 30 tablet 6  . aspirin EC 81 MG tablet Take 81 mg by mouth daily.    Marland Kitchen. HYDROcodone-acetaminophen (NORCO/VICODIN) 5-325 MG tablet Take 1-2 tablets by mouth at bedtime as needed. 20 tablet 0  .  losartan-hydrochlorothiazide (HYZAAR) 100-25 MG tablet Take 1 tablet by mouth daily. 30 tablet 6  . metoprolol succinate (TOPROL-XL) 50 MG 24 hr tablet Take 1 tablet (50 mg total) by mouth daily. Take with or immediately following a meal. 30 tablet 6  . potassium chloride SA (K-DUR,KLOR-CON) 20 MEQ tablet Take 1 tablet (20 mEq total) by mouth daily. 30 tablet 6   No current facility-administered medications on file prior to visit.     BP (!) 147/89 (BP Location: Right Arm, Patient Position: Sitting, Cuff Size: Small)   Pulse (!) 104   Temp (!) 101.7 F (38.7 C) (Oral)   Resp 16   Ht 5\' 5"  (1.651 m)   Wt 158 lb (71.7 kg)   LMP 03/25/2013   SpO2 98%   BMI 26.29 kg/m       Objective:   Physical Exam  Constitutional: She is oriented to person, place, and time. She appears well-developed and well-nourished.  Ill appearing female  HENT:  Head: Atraumatic.  Right Ear: Tympanic membrane and ear canal normal.  Left Ear:  Tympanic membrane and ear canal normal.  2-3+ tonsils with erythema and white exudate  Cardiovascular: Normal rate, regular rhythm and normal heart sounds.  No murmur heard. Pulmonary/Chest: Effort normal and breath sounds normal. No respiratory distress. She has no wheezes.  Lymphadenopathy:    She has cervical adenopathy.  Neurological: She is alert and oriented to person, place, and time.  Bilateral LE strength is 5/5  Psychiatric: She has a normal mood and affect. Her behavior is normal. Judgment and thought content normal.          Assessment & Plan:  Strep pharyngitis- rapid strep +. No sign of tonsilar abscess on exam. Pt is advised as follows:   For strep throat I would like for you to begin amoxicillin 3 times daily.  This is an antibiotic. For pain you may use the viscous lidocaine gargle and spit it back out every 4 hours as needed. You may also use ibuprofen 400 mg every 6 hours as needed for pain or fever. Drink plenty of fluids.  You may eat popsicles and cold drinks to help soothe your throat and keep you hydrated. Call if symptoms worsen, if you are unable to eat or drink please go to the emergency department. Please call if throat symptoms are not improved in 2-3 days.   Sciatica-  Will rx pred taper.  This will also help with the tonsilar pain/swelling.    Hypokalemia- Potassium 3.2.  Supplement has been sent.  Pt was advised to return in 1 week for bmet but declines, wants to complete in 2 weeks when she follows up with PCP.

## 2016-12-19 NOTE — Patient Instructions (Signed)
Please complete lab work prior to leaving. For strep throat I would like for you to begin amoxicillin 3 times daily.  This is an antibiotic. For pain you may use the viscous lidocaine gargle and spit it back out every 4 hours as needed. You may also use ibuprofen 400 mg every 6 hours as needed for pain or fever. Drink plenty of fluids.  You may eat popsicles and cold drinks to help soothe your throat and keep you hydrated. Call if symptoms worsen, if you are unable to eat or drink please go to the emergency department. Please call if throat symptoms are not improved in 2-3 days. For your back I have prescribed prednisone.  This may also help with some of the swelling in your throat.  I would like you to follow-up with Dr. Drue NovelPaz in 2 weeks.

## 2016-12-20 ENCOUNTER — Telehealth: Payer: Self-pay | Admitting: Family

## 2016-12-20 DIAGNOSIS — E876 Hypokalemia: Secondary | ICD-10-CM

## 2016-12-20 LAB — COMPREHENSIVE METABOLIC PANEL
ALBUMIN: 4.2 g/dL (ref 3.5–5.2)
ALK PHOS: 54 U/L (ref 39–117)
ALT: 17 U/L (ref 0–35)
AST: 23 U/L (ref 0–37)
BILIRUBIN TOTAL: 1.4 mg/dL — AB (ref 0.2–1.2)
BUN: 16 mg/dL (ref 6–23)
CO2: 30 mEq/L (ref 19–32)
Calcium: 8.9 mg/dL (ref 8.4–10.5)
Chloride: 99 mEq/L (ref 96–112)
Creatinine, Ser: 1.04 mg/dL (ref 0.40–1.20)
GFR: 57.56 mL/min — AB (ref >60.00)
Glucose, Bld: 110 mg/dL — ABNORMAL HIGH (ref 70–99)
POTASSIUM: 3.2 meq/L — AB (ref 3.5–5.1)
Sodium: 137 mEq/L (ref 135–145)
TOTAL PROTEIN: 7.5 g/dL (ref 6.0–8.3)

## 2016-12-20 MED ORDER — POTASSIUM CHLORIDE CRYS ER 20 MEQ PO TBCR: 20.0000 meq | EXTENDED_RELEASE_TABLET | Freq: Every day | ORAL | 2 refills | Status: DC

## 2016-12-20 NOTE — Telephone Encounter (Signed)
Please contact patient and let her know that her potassium is low.  Based on her refills it does not appear that she has been taking the potassium supplement.  I would recommend that she restart K Dur-she is to take 2 tabs by mouth today then 1 tablet daily.  Repeat basic metabolic panel in 1 week.  Patient is Spanish-speaking, I will also forward to Windell MouldingRuth to help translate.  How is she feeling from her strep throat?

## 2016-12-21 NOTE — Telephone Encounter (Signed)
Talked to patient ( in spanish), she is feeling better, swelling of the throat is going down. She is eating mainly soups and juices. I advised her to stay hydrated. Labs reviewed with patient and also advised to restart the K dur. She voiced understanding and will call pharmacy today to get refill.

## 2016-12-21 NOTE — Telephone Encounter (Signed)
Ok

## 2016-12-21 NOTE — Addendum Note (Signed)
Addended by: Wilford CornerSIERRA, Birdena Kingma M on: 12/21/2016 09:41 AM   Modules accepted: Orders

## 2016-12-21 NOTE — Addendum Note (Signed)
Addended by: Sandford Craze'SULLIVAN, Guadalupe Kerekes on: 12/21/2016 09:19 AM   Modules accepted: Orders

## 2016-12-21 NOTE — Telephone Encounter (Signed)
Pt aware.

## 2016-12-21 NOTE — Telephone Encounter (Signed)
Can you please schedule a follow up lab appointment in 1 week with the patient?

## 2016-12-21 NOTE — Telephone Encounter (Signed)
Talked to patient, she is scheduled for 2 weeks follow up on 01-04-17, she asked if ok to do labs that same day. She already started to take the K dur.

## 2017-01-04 ENCOUNTER — Ambulatory Visit: Payer: 59 | Admitting: Internal Medicine

## 2017-01-10 ENCOUNTER — Ambulatory Visit: Payer: 59 | Admitting: Internal Medicine

## 2017-01-10 ENCOUNTER — Telehealth: Payer: Self-pay

## 2017-01-10 DIAGNOSIS — Z0289 Encounter for other administrative examinations: Secondary | ICD-10-CM

## 2017-01-10 NOTE — Telephone Encounter (Signed)
Multiple no shows-  01/10/2017- follow-up 01/04/2017- follow-up 10/28/2016- hospital follow-up 07/12/2016- acute 07/08/2014- cpe  Would you like to begin dismissal process?

## 2017-01-11 NOTE — Telephone Encounter (Signed)
Dismissal letter printed, signed by Dr. Drue NovelPaz and forwarded to [place], Research officer, political partypractice administrator.

## 2017-01-11 NOTE — Telephone Encounter (Signed)
Start dismissal process 

## 2017-01-12 ENCOUNTER — Telehealth: Payer: Self-pay | Admitting: Internal Medicine

## 2017-01-12 NOTE — Telephone Encounter (Signed)
Patient dismissed [facility] by Willow OraJose Paz MD , effective January 11, 2017. Dismissal letter sent out by certified / registered mail. daj

## 2017-01-19 NOTE — Telephone Encounter (Signed)
Received signed domestic return receipt verifying delivery of certified letter on January 16, 2017. Article number [id number] daj

## 2017-07-01 ENCOUNTER — Other Ambulatory Visit: Payer: Self-pay | Admitting: Internal Medicine

## 2017-07-02 ENCOUNTER — Other Ambulatory Visit: Payer: Self-pay | Admitting: Internal Medicine

## 2018-07-12 IMAGING — DX DG CHEST 2V
2 series · 2 of 2 positions shown · non-contrast
Comparison: PA and lateral chest x-ray February 23, 2012

CLINICAL DATA: Right-sided back pain for the past 2 weeks with no
known injury. The pain has a pleuritic component.

EXAM:
CHEST  2 VIEW

[chest pa]
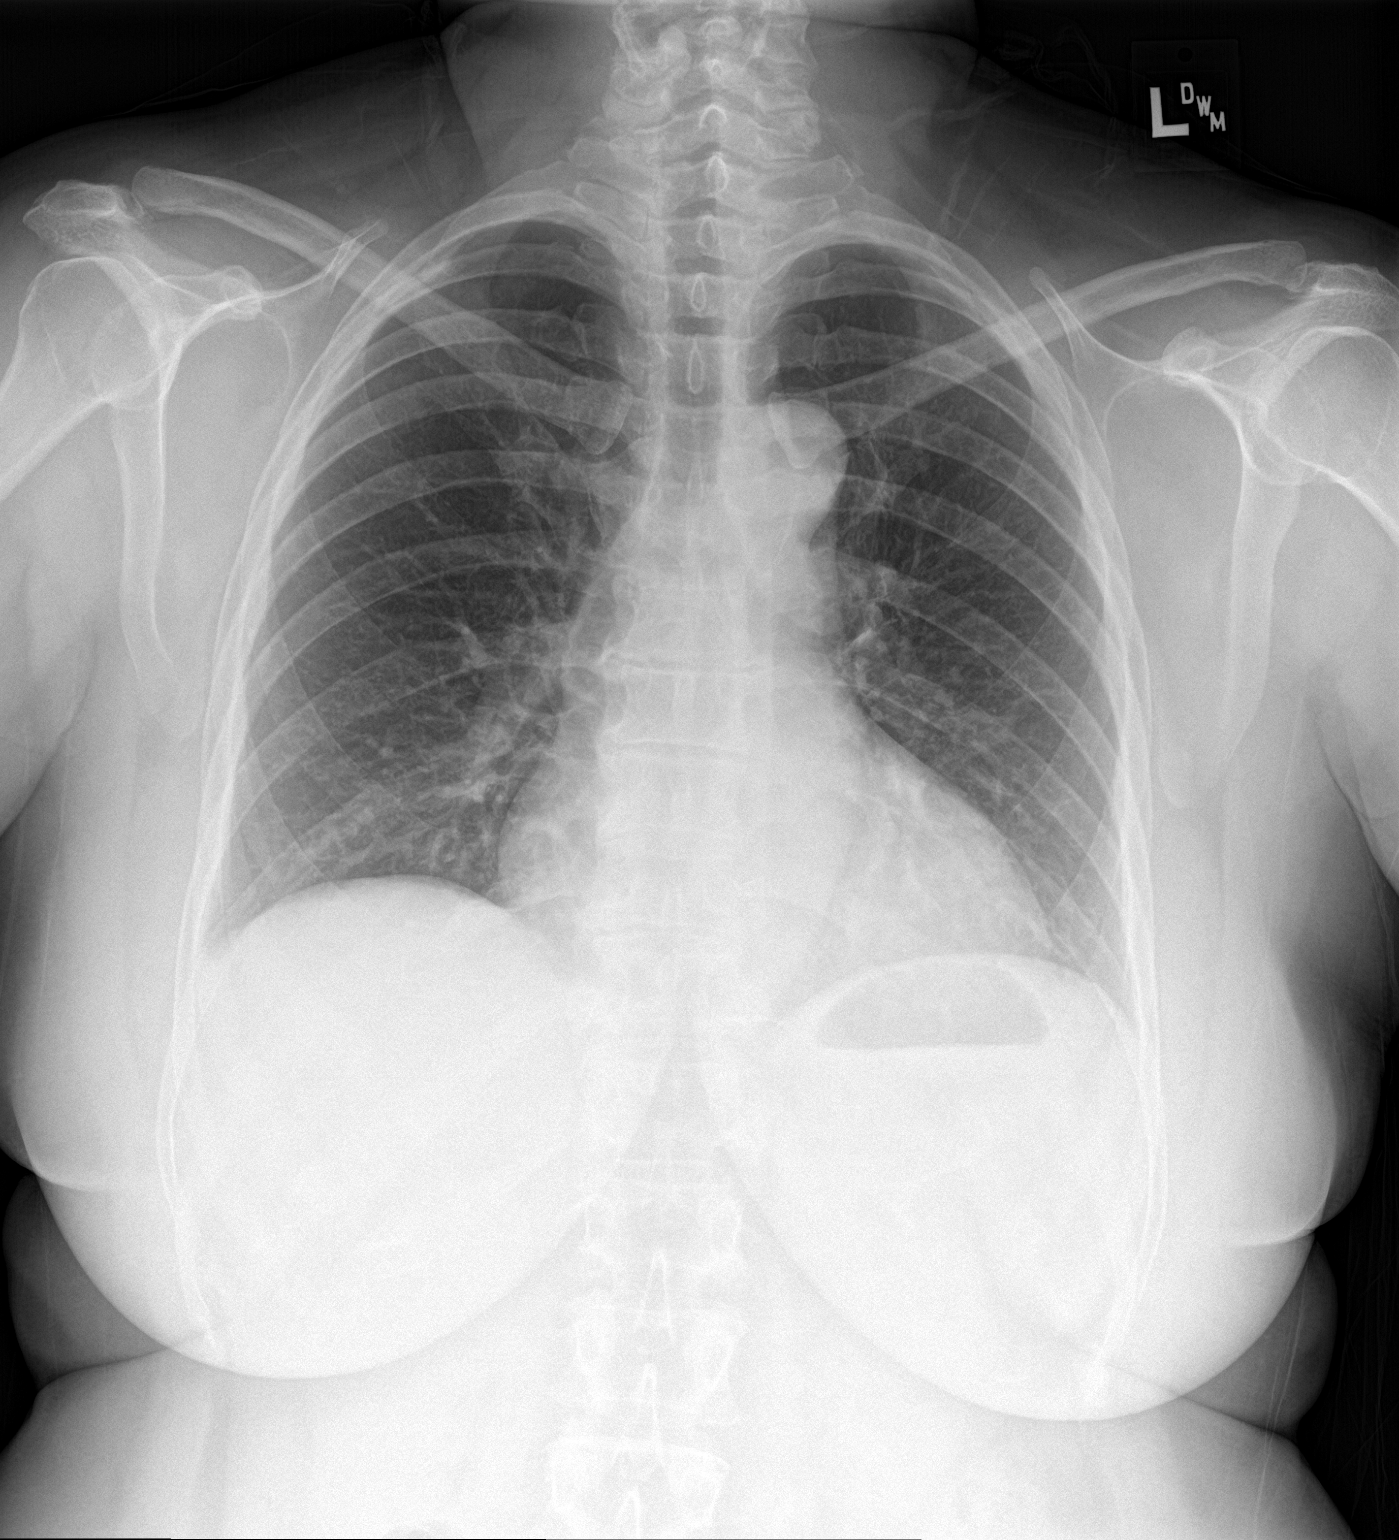

[chest lat]
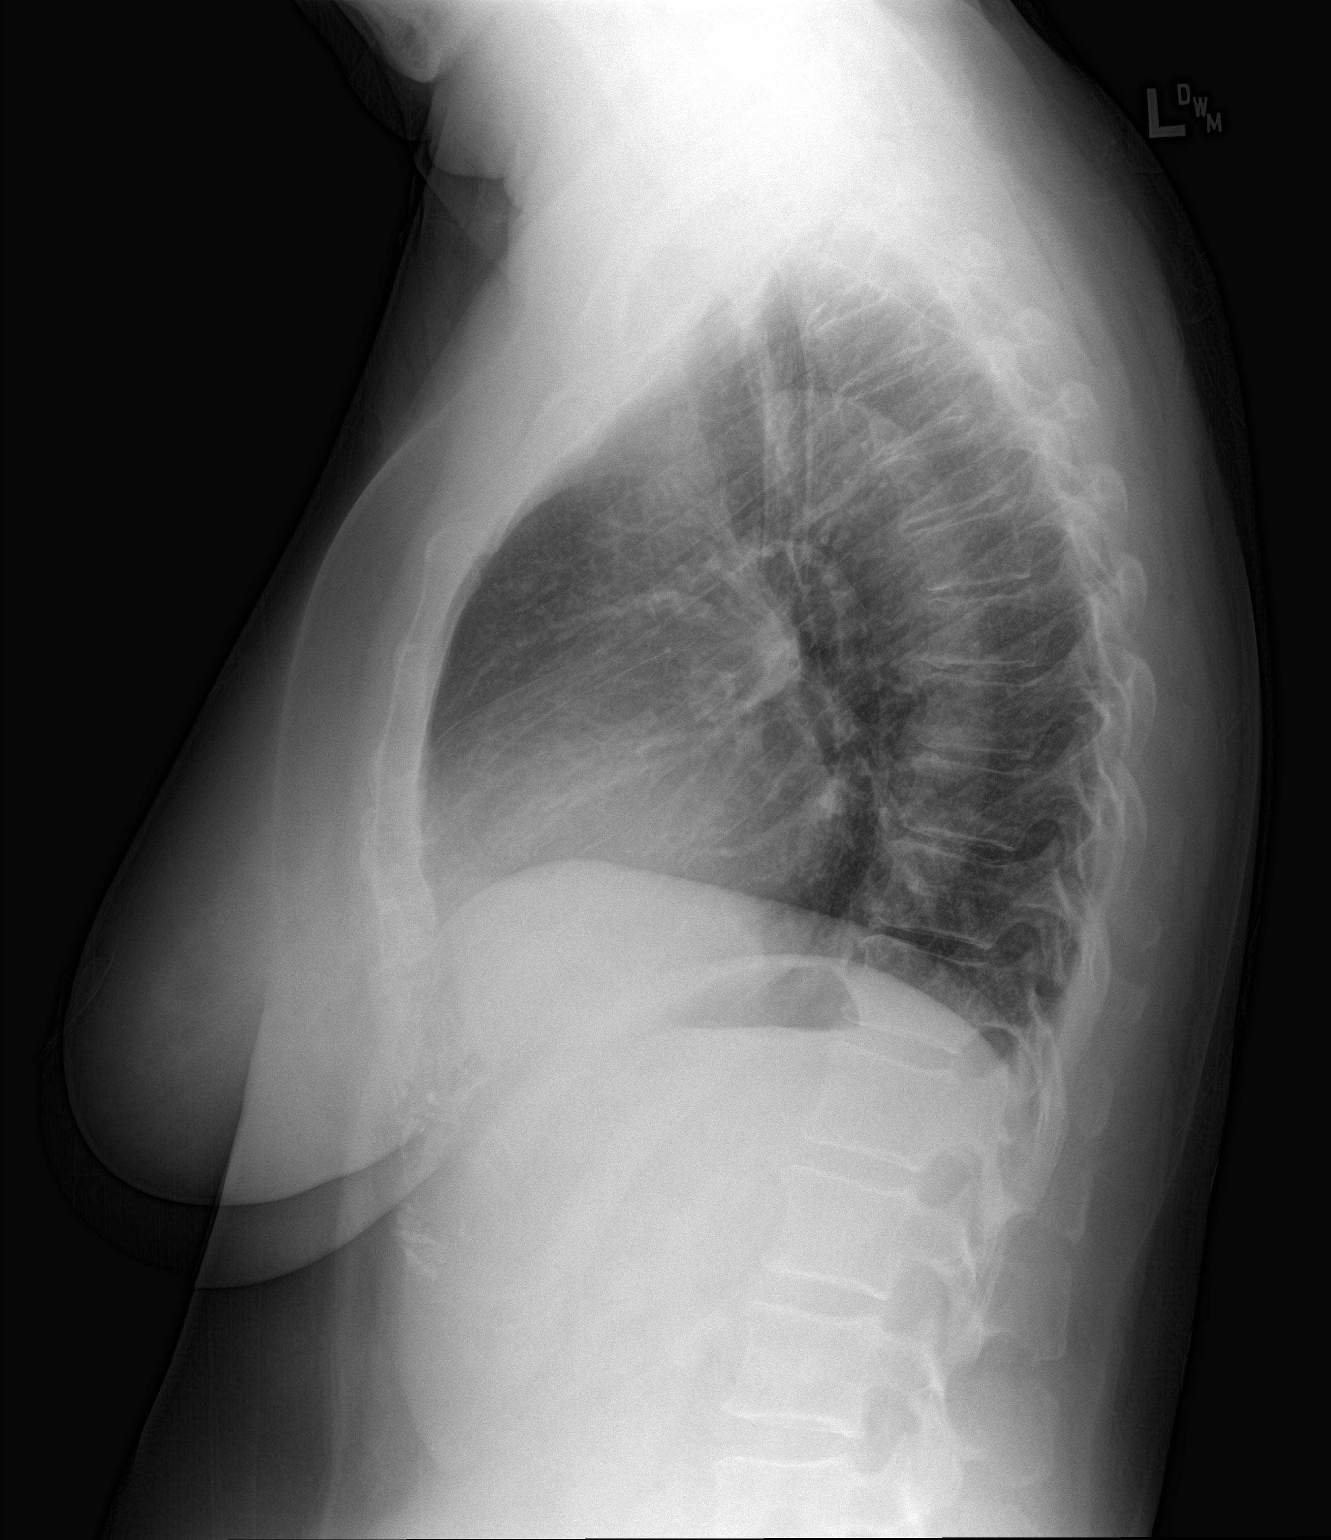

[2 of 2 positions shown; findings below may reference images not displayed]

FINDINGS: The lungs are adequately inflated. There is no focal infiltrate.
There is no pleural effusion. The heart and pulmonary vascularity
are normal. The mediastinum is normal in width. The trachea is
midline. There is calcification in the wall of the aortic arch. The
thoracic vertebral bodies are preserved in height where visualized.
There is mild multilevel degenerative disc space narrowing.
IMPRESSION: There is no pneumonia, CHF, nor other acute cardiopulmonary disease.

If the patient's symptoms persists, chest CT scanning would be a
useful next imaging step.

## 2019-07-22 ENCOUNTER — Encounter (HOSPITAL_BASED_OUTPATIENT_CLINIC_OR_DEPARTMENT_OTHER): Payer: Self-pay

## 2019-07-22 ENCOUNTER — Emergency Department (HOSPITAL_BASED_OUTPATIENT_CLINIC_OR_DEPARTMENT_OTHER): Payer: BC Managed Care – PPO

## 2019-07-22 ENCOUNTER — Emergency Department (HOSPITAL_BASED_OUTPATIENT_CLINIC_OR_DEPARTMENT_OTHER)
Admission: EM | Admit: 2019-07-22 | Discharge: 2019-07-22 | Disposition: A | Discharge status: Home / Self Care | Payer: BC Managed Care – PPO | Attending: Emergency Medicine | Admitting: Emergency Medicine

## 2019-07-22 ENCOUNTER — Other Ambulatory Visit: Payer: Self-pay

## 2019-07-22 DIAGNOSIS — Z79899 Other long term (current) drug therapy: Secondary | ICD-10-CM | POA: Diagnosis not present

## 2019-07-22 DIAGNOSIS — Z20822 Contact with and (suspected) exposure to covid-19: Secondary | ICD-10-CM | POA: Diagnosis not present

## 2019-07-22 DIAGNOSIS — Z7982 Long term (current) use of aspirin: Secondary | ICD-10-CM | POA: Diagnosis not present

## 2019-07-22 DIAGNOSIS — Z8673 Personal history of transient ischemic attack (TIA), and cerebral infarction without residual deficits: Secondary | ICD-10-CM | POA: Insufficient documentation

## 2019-07-22 DIAGNOSIS — J181 Lobar pneumonia, unspecified organism: Secondary | ICD-10-CM | POA: Diagnosis not present

## 2019-07-22 DIAGNOSIS — I10 Essential (primary) hypertension: Secondary | ICD-10-CM | POA: Diagnosis not present

## 2019-07-22 DIAGNOSIS — J189 Pneumonia, unspecified organism: Secondary | ICD-10-CM

## 2019-07-22 DIAGNOSIS — R05 Cough: Secondary | ICD-10-CM | POA: Diagnosis present

## 2019-07-22 LAB — SARS CORONAVIRUS 2 BY RT PCR (HOSPITAL ORDER, PERFORMED IN ~~LOC~~ HOSPITAL LAB): SARS Coronavirus 2: NEGATIVE

## 2019-07-22 MED ORDER — SODIUM CHLORIDE 0.9 % IV SOLN
1.0000 g | Freq: Once | INTRAVENOUS | Status: AC
  Administered 2019-07-22: 14:00:00 1 g via INTRAVENOUS
  Filled 2019-07-22: qty 10

## 2019-07-22 MED ORDER — AZITHROMYCIN 250 MG PO TABS: 250.0000 mg | ORAL_TABLET | Freq: Every day | ORAL | 0 refills | Status: DC

## 2019-07-22 NOTE — ED Provider Notes (Signed)
[facility] EMERGENCY DEPARTMENT Provider Note   CSN: [id number] Arrival date & time: 07/22/19  1057     History Chief Complaint  Patient presents with  . Cough  . Chest Pain    Eleanor Gatliff is a 64 y.o. female.  Patient is a 64 year old female who presents with cough and fever.  She has had 2-day history of cough which is productive of yellow sputum.  She has had some associated fevers and chills.  She has had some diffuse myalgias.  She has runny nose and nasal congestion.  No shortness of breath other than that caused by her nasal congestion.  No increased leg swelling.  No vomiting.  No known Covid exposures.  She has not been vaccinated for Covid.  She took some ibuprofen last night and felt a little bit better.  History was obtained through the video language line.        Past Medical History:  Diagnosis Date  . Anemia   . Anxiety and depression    admited w/ xanax overdose 6-12, Rx lexapro per psych  . Arthritis   . CVA (cerebral infarction)    per MRI 07-2010  . Headache(784.0)   . Hypertension   . Hypokalemia   . NSVD (normal spontaneous vaginal delivery)    X3  . Sciatica    receiving R S1 selective nerve blocks from neurosurgery    Patient Active Problem List   Diagnosis Date Noted  . 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 Lumbar radiculopathy 07/28/2016  . PCP NOTES >>>>>> 03/29/2016  . Depression 06/25/2013  . Annual physical exam 04/06/2012  . CVA (cerebral infarction)   . DYSPNEA ON EXERTION 01/19/2010  . Anemia 12/14/2006  . Essential hypertension 12/14/2006  . HEADACHE 12/14/2006    Past Surgical History:  Procedure Laterality Date  . APPENDECTOMY    . BLADDER SURGERY    . BLADDER SUSPENSION     DONE IN [facility]  . CESAREAN SECTION    . ENDOMETRIAL ABLATION     HER OPTION  . TUBAL LIGATION       OB History    Gravida  5   Para  3   Term  3   Preterm      AB  2   Living  3     SAB  2   TAB      Ectopic       Multiple      Live Births  3           Family History  Problem Relation Age of Onset  . Heart attack Father 84  . Diabetes Other        aunt  . Hypertension Other        many family members  . Breast cancer Other        distant relative   . Colon cancer Neg Hx     Social History   Tobacco Use  . Smoking status: Never Smoker  . Smokeless tobacco: Never Used  Substance Use Topics  . Alcohol use: No  . Drug use: No    Home Medications Prior to Admission medications   Medication Sig Start Date End Date Taking? Authorizing Provider  amLODipine (NORVASC) 10 MG tablet Take 1 tablet (10 mg total) by mouth daily. 10/10/16   Wanda Plump, MD  amoxicillin (AMOXIL) 500 MG capsule Take 1 capsule (500 mg total) by mouth 3 (three) times daily. 12/19/16   Sandford Craze, NP  aspirin EC 81 MG tablet Take 81 mg by mouth daily.    [provider]  azithromycin (ZITHROMAX) 250 MG tablet Take 1 tablet (250 mg total) by mouth daily. Take first 2 tablets together, then 1 every day until finished. 07/22/19   Rolan Bucco, MD  HYDROcodone-acetaminophen (NORCO/VICODIN) 5-325 MG tablet Take 1-2 tablets by mouth at bedtime as needed. 10/10/16   Wanda Plump, MD  lidocaine (XYLOCAINE) 2 % solution 15 mL gargle and spit every 4 hours as needed 12/19/16   Sandford Craze, NP  losartan-hydrochlorothiazide (HYZAAR) 100-25 MG tablet Take 1 tablet by mouth daily. 10/10/16   Wanda Plump, MD  metoprolol succinate (TOPROL-XL) 50 MG 24 hr tablet Take 1 tablet (50 mg total) by mouth daily. Take with or immediately following a meal. 10/10/16   Wanda Plump, MD  potassium chloride SA (K-DUR,KLOR-CON) 20 MEQ tablet Take 1 tablet (20 mEq total) by mouth daily. 12/20/16   Sandford Craze, NP  predniSONE (DELTASONE) 10 MG tablet 4 tabs PO daily for 2 days, then 3 tablets p.o. daily times 2 days, then 2 tabs p.o. daily times 2 days, then 1 tab p.o. daily times 2 days 12/19/16   Sandford Craze, NP     Allergies    Lisinopril  Review of Systems   Review of Systems  Constitutional: Positive for chills, fatigue and fever. Negative for diaphoresis.  HENT: Positive for congestion and rhinorrhea. Negative for sneezing.   Eyes: Negative.   Respiratory: Positive for cough. Negative for chest tightness and shortness of breath.   Cardiovascular: Positive for chest pain. Negative for leg swelling.  Gastrointestinal: Negative for abdominal pain, blood in stool, diarrhea, nausea and vomiting.  Genitourinary: Negative for difficulty urinating, flank pain, frequency and hematuria.  Musculoskeletal: Positive for myalgias. Negative for arthralgias and back pain.  Skin: Negative for rash.  Neurological: Negative for dizziness, speech difficulty, weakness, numbness and headaches.    Physical Exam Updated Vital Signs BP 134/82 (BP Location: Right Arm)   Pulse 61   Temp 98.9 F (37.2 C) (Oral)   Resp 19   Ht 5\' 5"  (1.651 m)   Wt 67.8 kg   LMP 03/25/2013   SpO2 100%   BMI 24.86 kg/m   Physical Exam Constitutional:      Appearance: She is well-developed.  HENT:     Head: Normocephalic and atraumatic.  Eyes:     Pupils: Pupils are equal, round, and reactive to light.  Cardiovascular:     Rate and Rhythm: Normal rate and regular rhythm.     Heart sounds: Normal heart sounds.  Pulmonary:     Effort: Pulmonary effort is normal. No respiratory distress.     Breath sounds: Normal breath sounds. No wheezing or rales.  Chest:     Chest wall: No tenderness.  Abdominal:     General: Bowel sounds are normal.     Palpations: Abdomen is soft.     Tenderness: There is no abdominal tenderness. There is no guarding or rebound.  Musculoskeletal:        General: Normal range of motion.     Cervical back: Normal range of motion and neck supple.     Right lower leg: No tenderness. No edema.     Left lower leg: No tenderness. No edema.     Comments: No edema or calf tenderness  Lymphadenopathy:      Cervical: No cervical adenopathy.  Skin:    General: Skin is warm and dry.  Findings: No rash.  Neurological:     Mental Status: She is alert and oriented to person, place, and time.     ED Results / Procedures / Treatments   Labs (all labs ordered are listed, but only abnormal results are displayed) Labs Reviewed  SARS CORONAVIRUS 2 BY RT PCR [facility] ORDER, PERFORMED IN [facility] LAB)    EKG EKG Interpretation  Date/Time:  Monday July 22 2019 11:32:45 EDT Ventricular Rate:  63 PR Interval:    QRS Duration: 106 QT Interval:  405 QTC Calculation: 415 R Axis:   -43 Text Interpretation: Sinus rhythm Left axis deviation Borderline T abnormalities, anterior leads since last tracing no significant change Confirmed by Rolan Bucco [id number]) on 07/22/2019 11:53:26 AM   Radiology DG Chest Port 1 View  Result Date: 07/22/2019 CLINICAL DATA:  Pt arrives ambulatory to ED with c/o cough since last Saturday. Pt also reports fever and chills EXAM: PORTABLE CHEST 1 VIEW COMPARISON:  Chest radiograph 03/28/2016 FINDINGS: Stable cardiomediastinal contours. Low lung volumes. There are mildly increased opacities in the medial right lung base which may reflect bronchovascular crowding. The left lung is clear. No pneumothorax or significant pleural effusion. No acute finding in the visualized skeleton. IMPRESSION: Low volume study. Mildly increased opacities in the medial right lung base may reflect bronchovascular crowding, infection not excluded. Electronically Signed   By: Emmaline Kluver M.D.   On: 07/22/2019 12:06    Procedures Procedures (including critical care time)  Medications Ordered in ED Medications  cefTRIAXone (ROCEPHIN) 1 g in sodium chloride 0.9 % 100 mL IVPB (1 g Intravenous New Bag/Given 07/22/19 1408)    ED Course  I have reviewed the triage vital signs and the nursing notes.  Pertinent labs & imaging results that were available during my care of the  patient were reviewed by me and considered in my medical decision making (see chart for details).    MDM Rules/Calculators/A&P                          Pt presents with cough and fever.  CXR is suggestive of pneumonia.  No hypoxia, Covid negative.  No suggestions of ACS.  Otherwise well appearing.  Will tx with abx.  Return precautions given.  Discussed with pt and her husband. Final Clinical Impression(s) / ED Diagnoses Final diagnoses:  Community acquired pneumonia of right lower lobe of lung    Rx / DC Orders ED Discharge Orders         Ordered    azithromycin (ZITHROMAX) 250 MG tablet  Daily     Discontinue  Reprint     07/22/19 1501           Rolan Bucco, MD 07/22/19 1504

## 2019-07-22 NOTE — ED Triage Notes (Signed)
Pt arrives ambulatory to ED with c/o cough since last Saturday. Pt also reports fever and chills.

## 2019-09-04 ENCOUNTER — Other Ambulatory Visit: Payer: Self-pay

## 2019-09-08 ENCOUNTER — Telehealth: Payer: Self-pay | Admitting: Nurse Practitioner

## 2019-09-08 NOTE — Telephone Encounter (Signed)
Called to discuss with patient about Covid symptoms and the use of casirivimab/imdevimab, a monoclonal antibody infusion for those with mild to moderate Covid symptoms and at a high risk of hospitalization.  Pt is qualified for this infusion at the [facility] infusion center due to; Specific high risk criteria : Cardiovascular disease or hypertension   I briefly discussed symptoms with pt, however it became clear that an interpreter would be required.  I called back via the Spanish interpreter, and unfortunately, she did not answer the phone on 2 separate attempts.  Message left to call back our hotline [phone].  Robin Ducking, NP

## 2019-09-10 ENCOUNTER — Other Ambulatory Visit: Payer: Self-pay | Admitting: Physician Assistant

## 2019-09-10 ENCOUNTER — Ambulatory Visit (HOSPITAL_COMMUNITY)
Admission: RE | Admit: 2019-09-10 | Discharge: 2019-09-10 | Disposition: A | Discharge status: Home / Self Care | Payer: HRSA Program | Source: Ambulatory Visit | Attending: Pulmonary Disease | Admitting: Pulmonary Disease

## 2019-09-10 DIAGNOSIS — U071 COVID-19: Secondary | ICD-10-CM | POA: Insufficient documentation

## 2019-09-10 DIAGNOSIS — I10 Essential (primary) hypertension: Secondary | ICD-10-CM

## 2019-09-10 MED ORDER — METHYLPREDNISOLONE SODIUM SUCC 125 MG IJ SOLR: 125.0000 mg | Freq: Once | INTRAMUSCULAR | Status: DC | PRN

## 2019-09-10 MED ORDER — SODIUM CHLORIDE 0.9 % IV SOLN
1200.0000 mg | Freq: Once | INTRAVENOUS | Status: AC
  Administered 2019-09-10: 1200 mg via INTRAVENOUS

## 2019-09-10 MED ORDER — ALBUTEROL SULFATE HFA 108 (90 BASE) MCG/ACT IN AERS: 2.0000 | INHALATION_SPRAY | Freq: Once | RESPIRATORY_TRACT | Status: DC | PRN

## 2019-09-10 MED ORDER — SODIUM CHLORIDE 0.9 % IV SOLN: INTRAVENOUS | Status: DC | PRN

## 2019-09-10 MED ORDER — FAMOTIDINE IN NACL 20-0.9 MG/50ML-% IV SOLN: 20.0000 mg | Freq: Once | INTRAVENOUS | Status: DC | PRN

## 2019-09-10 MED ORDER — DIPHENHYDRAMINE HCL 50 MG/ML IJ SOLN: 50.0000 mg | Freq: Once | INTRAMUSCULAR | Status: DC | PRN

## 2019-09-10 MED ORDER — EPINEPHRINE 0.3 MG/0.3ML IJ SOAJ: 0.3000 mg | Freq: Once | INTRAMUSCULAR | Status: DC | PRN

## 2019-09-10 NOTE — Progress Notes (Signed)
I connected by phone with Ky Barban on 09/10/2019 at 3:14 PM to discuss the potential use of a new treatment for mild to moderate COVID-19 viral infection in non-hospitalized patients. Language line used for translation.   This patient is a 64 y.o. female that meets the FDA criteria for Emergency Use Authorization of COVID monoclonal antibody casirivimab/imdevimab.  Has a (+) direct SARS-CoV-2 viral test result  Has mild or moderate COVID-19   Is NOT hospitalized due to COVID-19  Is within 10 days of symptom onset  Has at least one of the high risk factor(s) for progression to severe COVID-19 and/or hospitalization as defined in EUA.  Specific high risk criteria : Cardiovascular disease or hypertension   I have spoken and communicated the following to the patient or parent/caregiver regarding COVID monoclonal antibody treatment:  1. FDA has authorized the emergency use for the treatment of mild to moderate COVID-19 in adults and pediatric patients with positive results of direct SARS-CoV-2 viral testing who are 30 years of age and older weighing at least 40 kg, and who are at high risk for progressing to severe COVID-19 and/or hospitalization.  2. The significant known and potential risks and benefits of COVID monoclonal antibody, and the extent to which such potential risks and benefits are unknown.  3. Information on available alternative treatments and the risks and benefits of those alternatives, including clinical trials.  4. Patients treated with COVID monoclonal antibody should continue to self-isolate and use infection control measures (e.g., wear mask, isolate, social distance, avoid sharing personal items, clean and disinfect "high touch" surfaces, and frequent handwashing) according to CDC guidelines.   5. The patient or parent/caregiver has the option to accept or refuse COVID monoclonal antibody treatment.  After reviewing this information with the patient, The patient  agreed to proceed with receiving casirivimab\imdevimab infusion and will be provided a copy of the Fact sheet prior to receiving the infusion.   Manson Passey 09/10/2019 3:14 PM

## 2019-09-10 NOTE — Discharge Instructions (Signed)

## 2019-09-10 NOTE — Progress Notes (Signed)
  Diagnosis: COVID-19  Physician: Wright  Procedure: Cov[facility] Infusion Clinic Med: casirivimab\imdevimab infusion - Provided patient with casirivimab\imdevimab fact sheet for patients, parents and caregivers prior to infusion.  Complications: No immediate complications noted.  Discharge: Discharged home   Sara Selvidge A 09/10/2019  

## 2021-09-24 ENCOUNTER — Ambulatory Visit (INDEPENDENT_AMBULATORY_CARE_PROVIDER_SITE_OTHER): Payer: Commercial Managed Care - PPO | Admitting: Obstetrics & Gynecology

## 2021-09-24 ENCOUNTER — Encounter: Payer: Self-pay | Admitting: Obstetrics & Gynecology

## 2021-09-24 ENCOUNTER — Other Ambulatory Visit (HOSPITAL_COMMUNITY)
Admission: RE | Admit: 2021-09-24 | Discharge: 2021-09-24 | Disposition: A | Discharge status: Home / Self Care | Payer: Commercial Managed Care - PPO | Source: Ambulatory Visit | Attending: Obstetrics & Gynecology | Admitting: Obstetrics & Gynecology

## 2021-09-24 VITALS — BP 122/80 | HR 62 | Ht 62.25 in | Wt 159.0 lb

## 2021-09-24 DIAGNOSIS — Z01419 Encounter for gynecological examination (general) (routine) without abnormal findings: Secondary | ICD-10-CM

## 2021-09-24 DIAGNOSIS — Z780 Asymptomatic menopausal state: Secondary | ICD-10-CM

## 2021-09-24 DIAGNOSIS — Z13820 Encounter for screening for osteoporosis: Secondary | ICD-10-CM

## 2021-09-24 NOTE — Progress Notes (Signed)
[name] 06/28/1955 [id number]   History:    66 y.o. [id number] Married  RP:  New (>3 years) patient presenting for annual gyn exam   HPI: Postmenopause, well on no HRT.  No PMB.  No pelvic pain.  Rarely sexually active.  No pain with IC.  Pap Neg in 2015.  No h/o abnormal Pap.  Pap reflex today.  Breasts normal.  Overdue for Mammo, will schedule.  Urine/BMs normal.  Never had a Colono, recommend scheduling now.  BMI 28.85.  Increase fitness activities. Will schedule BD here now. Health labs with Fam MD.  Past medical history,surgical history, family history and social history were all reviewed and documented in the EPIC chart.  Gynecologic History Patient's last menstrual period was 03/25/2013.  Obstetric History OB History  Gravida Para Term Preterm AB Living  5 3 3   2 3   SAB IAB Ectopic Multiple Live Births  2       3    # Outcome Date GA Lbr Len/2nd Weight Sex Delivery Anes PTL Lv  5 SAB           4 SAB           3 Term     F CS-Unspec  N LIV  2 Term     M Vag-Spont  N LIV  1 Term     F Vag-Spont  N LIV     ROS: A ROS was performed and pertinent positives and negatives are included in the history.  GENERAL: No fevers or chills. HEENT: No change in vision, no earache, sore throat or sinus congestion. NECK: No pain or stiffness. CARDIOVASCULAR: No chest pain or pressure. No palpitations. PULMONARY: No shortness of breath, cough or wheeze. GASTROINTESTINAL: No abdominal pain, nausea, vomiting or diarrhea, melena or bright red blood per rectum. GENITOURINARY: No urinary frequency, urgency, hesitancy or dysuria. MUSCULOSKELETAL: No joint or muscle pain, no back pain, no recent trauma. DERMATOLOGIC: No rash, no itching, no lesions. ENDOCRINE: No polyuria, polydipsia, no heat or cold intolerance. No recent change in weight. HEMATOLOGICAL: No anemia or easy bruising or bleeding. NEUROLOGIC: No headache, seizures, numbness, tingling or weakness. PSYCHIATRIC: No depression, no loss of  interest in normal activity or change in sleep pattern.     Exam:   BP 122/80   Pulse 62   Ht 5' 2.25" (1.581 m)   Wt 159 lb (72.1 kg)   LMP 03/25/2013   SpO2 99%   BMI 28.85 kg/m   Body mass index is 28.85 kg/m.  General appearance : Well developed well nourished female. No acute distress HEENT: Eyes: no retinal hemorrhage or exudates,  Neck supple, trachea midline, no carotid bruits, no thyroidmegaly Lungs: Clear to auscultation, no rhonchi or wheezes, or rib retractions  Heart: Regular rate and rhythm, no murmurs or gallops Breast:Examined in sitting and supine position were symmetrical in appearance, no palpable masses or tenderness,  no skin retraction, no nipple inversion, no nipple discharge, no skin discoloration, no axillary or supraclavicular lymphadenopathy Abdomen: no palpable masses or tenderness, no rebound or guarding Extremities: no edema or skin discoloration or tenderness  Pelvic: Vulva: Normal             Vagina: No gross lesions or discharge  Cervix: No gross lesions or discharge.  Pap reflex done.  Uterus  AV, normal size, shape and consistency, non-tender and mobile  Adnexa  Without masses or tenderness  Anus: Normal   Assessment/Plan:  66 y.o. female for  annual exam   1. Encounter for routine gynecological examination with Papanicolaou smear of cervix Postmenopause, well on no HRT.  No PMB.  No pelvic pain.  Rarely sexually active.  No pain with IC.  Pap Neg in 2015.  No h/o abnormal Pap.  Pap reflex today.  Breasts normal.  Overdue for Mammo, will schedule.  Urine/BMs normal.  Never had a Colono, recommend scheduling now.  BMI 28.85.  Increase fitness activities. Will schedule BD here now. Health labs with Fam MD. - Cytology - PAP( [place])  2. Postmenopause Postmenopause, well on no HRT.  No PMB.  No pelvic pain.  Rarely sexually active.  No pain with IC.  3. Screening for osteoporosis Postmenopause on no HRT.  BMI 28.85.  Increase  fitness/weight bearing activities. Will schedule BD here now.  Vit D, Vit K2, Ca++ 1.5 g/d total. - DG Bone Density; Future   Princess Bruins MD, 11:50 AM 09/24/2021

## 2021-09-28 LAB — CYTOLOGY - PAP: Diagnosis: NEGATIVE

## 2021-10-06 DIAGNOSIS — Z0289 Encounter for other administrative examinations: Secondary | ICD-10-CM

## 2021-10-19 ENCOUNTER — Other Ambulatory Visit: Payer: Self-pay | Admitting: Obstetrics & Gynecology

## 2021-10-19 ENCOUNTER — Ambulatory Visit (INDEPENDENT_AMBULATORY_CARE_PROVIDER_SITE_OTHER): Payer: Commercial Managed Care - PPO

## 2021-10-19 DIAGNOSIS — M8588 Other specified disorders of bone density and structure, other site: Secondary | ICD-10-CM

## 2021-10-19 DIAGNOSIS — Z13820 Encounter for screening for osteoporosis: Secondary | ICD-10-CM | POA: Diagnosis not present

## 2021-10-19 DIAGNOSIS — Z780 Asymptomatic menopausal state: Secondary | ICD-10-CM

## 2022-09-28 ENCOUNTER — Ambulatory Visit: Payer: Commercial Managed Care - PPO | Admitting: Obstetrics & Gynecology

## 2023-11-28 ENCOUNTER — Other Ambulatory Visit (HOSPITAL_COMMUNITY): Payer: Self-pay | Admitting: Internal Medicine

## 2023-11-28 DIAGNOSIS — R9439 Abnormal result of other cardiovascular function study: Secondary | ICD-10-CM
